# Patient Record
Sex: Female | Born: 1980 | Race: White | Hispanic: Yes | Marital: Single | Smoking: Never smoker
Health system: Southern US, Community
[De-identification: ages and names within clinical notes are randomized; demographics above are authoritative.]

## PROBLEM LIST (undated history)

## (undated) DIAGNOSIS — IMO0002 Reserved for concepts with insufficient information to code with codable children: Principal | ICD-10-CM

## (undated) DIAGNOSIS — E079 Disorder of thyroid, unspecified: Secondary | ICD-10-CM

## (undated) DIAGNOSIS — K8020 Calculus of gallbladder without cholecystitis without obstruction: Secondary | ICD-10-CM

## (undated) DIAGNOSIS — E669 Obesity, unspecified: Secondary | ICD-10-CM

## (undated) DIAGNOSIS — F419 Anxiety disorder, unspecified: Secondary | ICD-10-CM

## (undated) DIAGNOSIS — K819 Cholecystitis, unspecified: Secondary | ICD-10-CM

## (undated) HISTORY — DX: Reserved for concepts with insufficient information to code with codable children: IMO0002

## (undated) HISTORY — PX: CHOLECYSTECTOMY: SHX55

---

## 2007-10-21 ENCOUNTER — Ambulatory Visit (HOSPITAL_COMMUNITY): Admission: EM | Admit: 2007-10-21 | Discharge: 2007-10-22 | Payer: Self-pay | Admitting: Emergency Medicine

## 2007-10-21 ENCOUNTER — Encounter (INDEPENDENT_AMBULATORY_CARE_PROVIDER_SITE_OTHER): Payer: Self-pay | Admitting: General Surgery

## 2008-11-19 ENCOUNTER — Other Ambulatory Visit: Admission: RE | Admit: 2008-11-19 | Discharge: 2008-11-19 | Payer: Self-pay | Admitting: Family Medicine

## 2009-01-01 IMAGING — CT CT PELVIS W/ CM
2 of 5 series · 17 of 46 positions shown, 19 images · IV contrast (omnipaque)
Comparison: None.

CLINICAL DATA: 27-year-old female with abdominal pain.  
 ABDOMEN CT WITH CONTRAST:
TECHNIQUE: Multidetector CT imaging of the abdomen was performed following the standard protocol during bolus administration of intravenous contrast.
 Contrast:  100 cc Omnipaque 300
TECHNIQUE: Multidetector CT imaging of the pelvis was performed following the standard protocol during bolus administration of intravenous contrast.

[Series 2: abd_pel 5.0 b40f st · axial · 0.69mm/px · z∈[+738,+1164]mm · 14 of 97 slices shown, 16 images]
[im 6/97  soft-tissue]
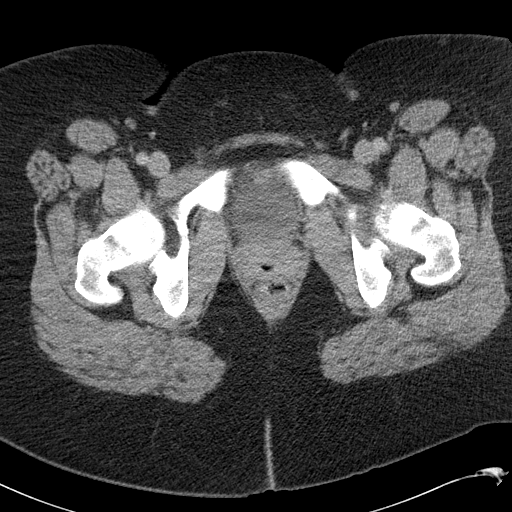
[im 6/97  bone]
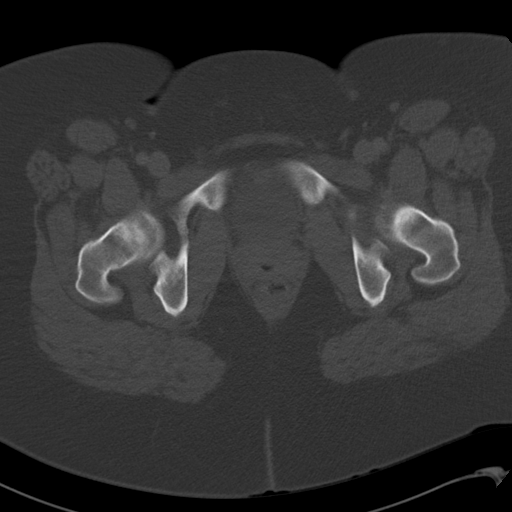
[im 11/97  soft-tissue]
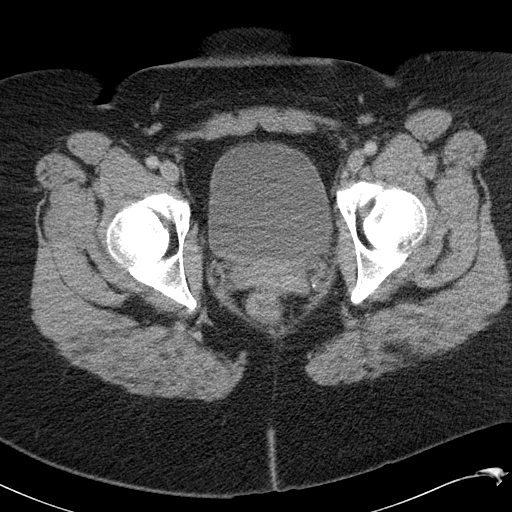
[im 21/97  soft-tissue]
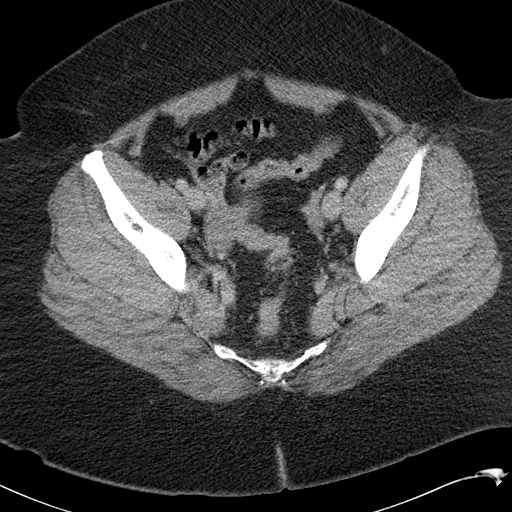
[im 26/97  soft-tissue]
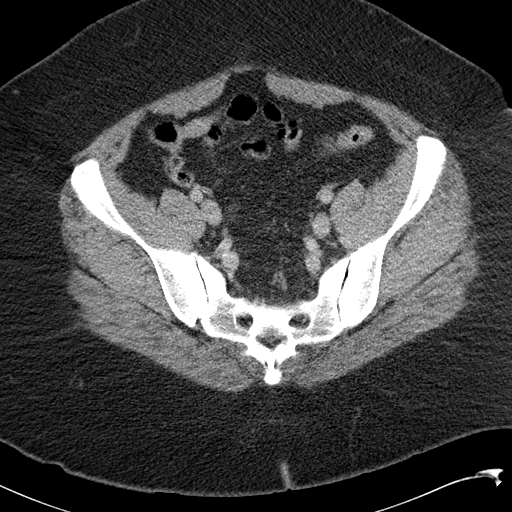
[im 31/97  soft-tissue]
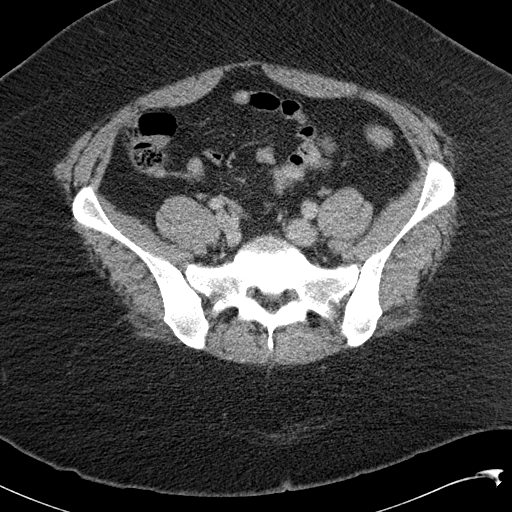
[im 41/97  soft-tissue]
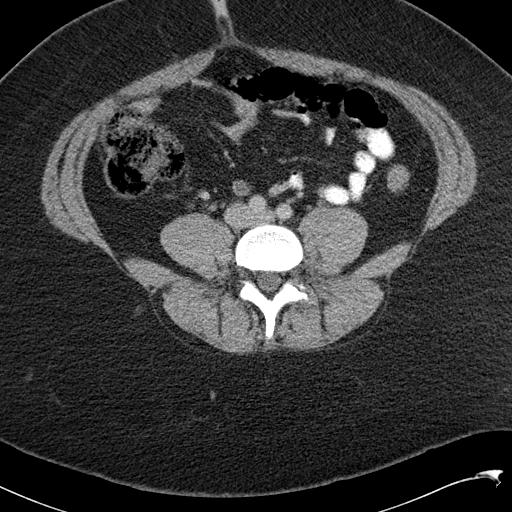
[im 46/97  soft-tissue]
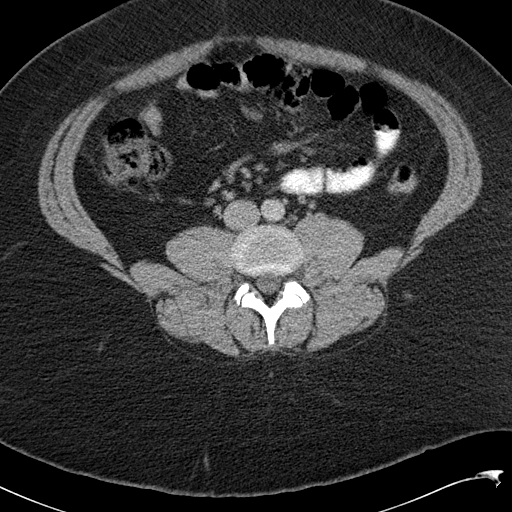
[im 51/97  soft-tissue]
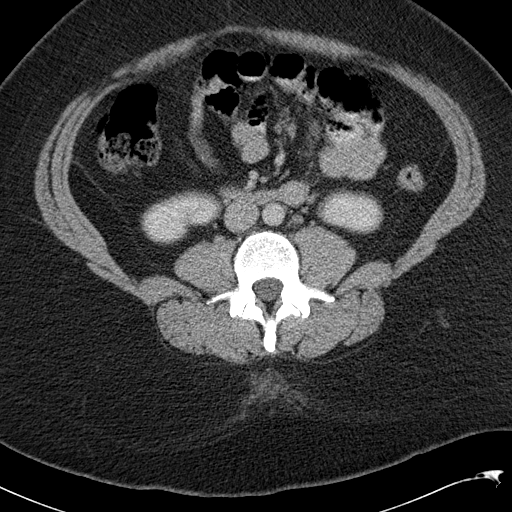
[im 56/97  soft-tissue]
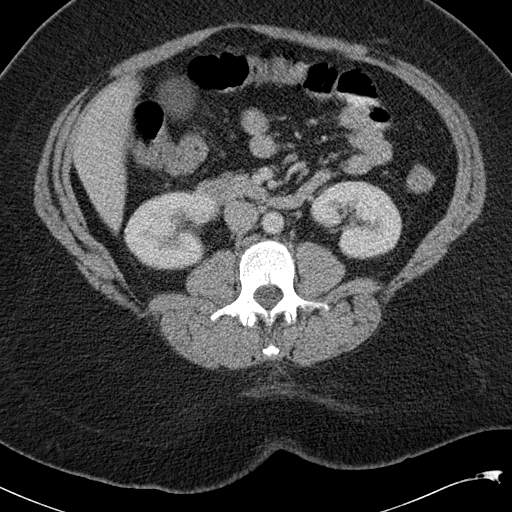
[im 56/97  bone]
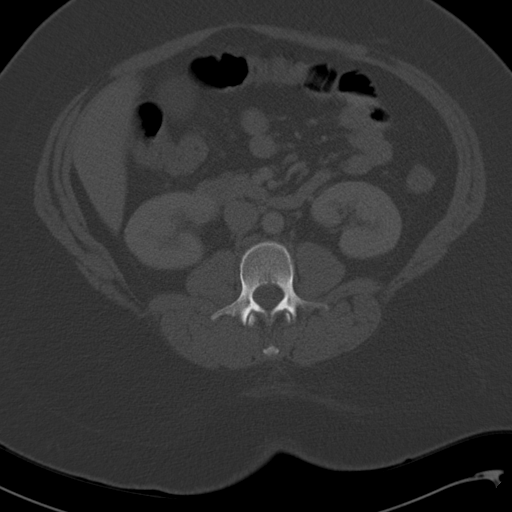
[im 66/97  soft-tissue]
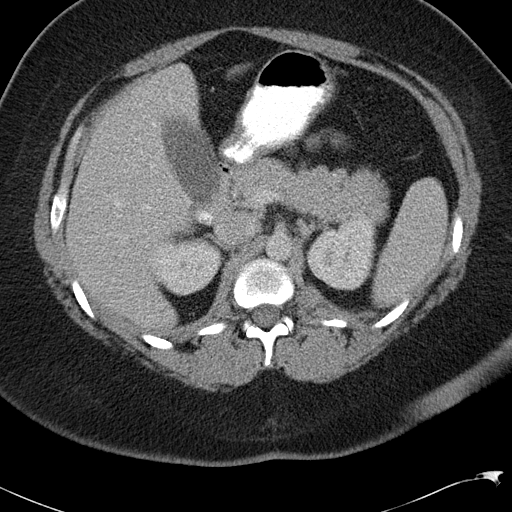
[im 71/97  soft-tissue]
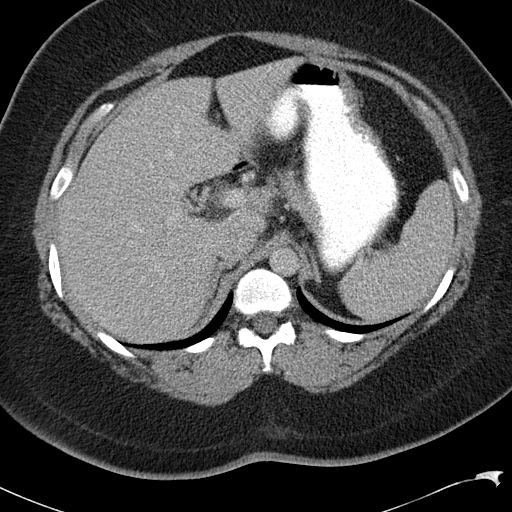
[im 76/97  soft-tissue]
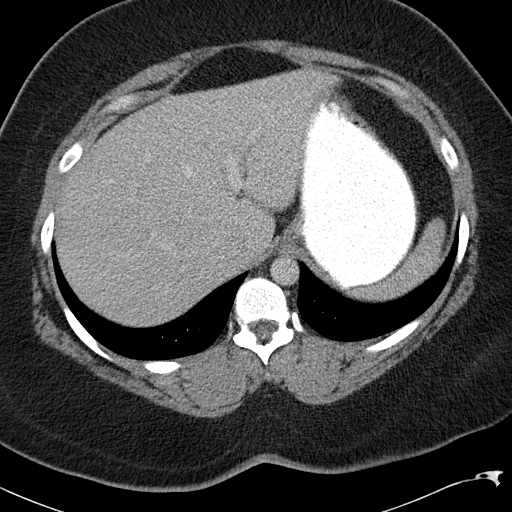
[im 86/97  soft-tissue]
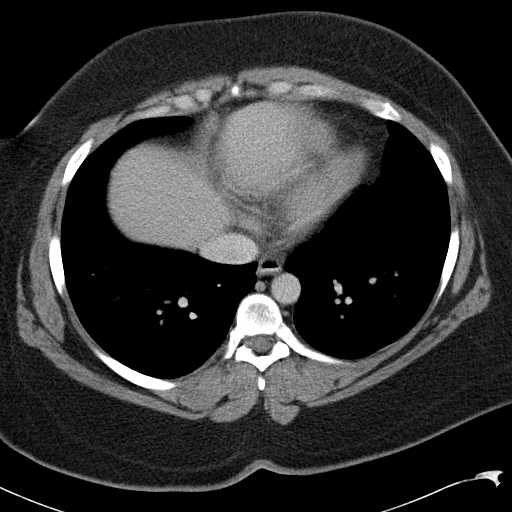
[im 91/97  soft-tissue]
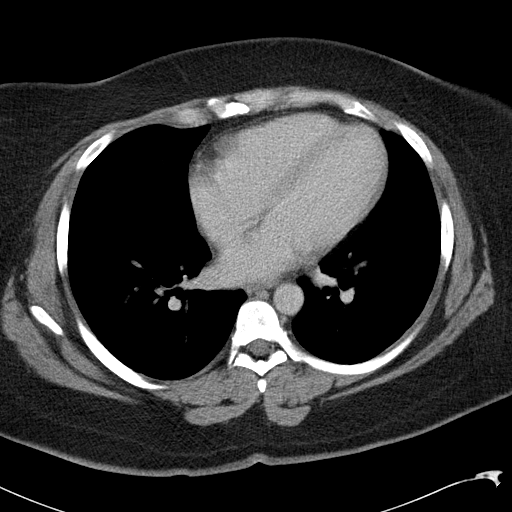

[Series 602: <mpr thick range> · coronal · 0.98mm/px · 3 of 86 slices shown]
[im 29/86  soft-tissue]
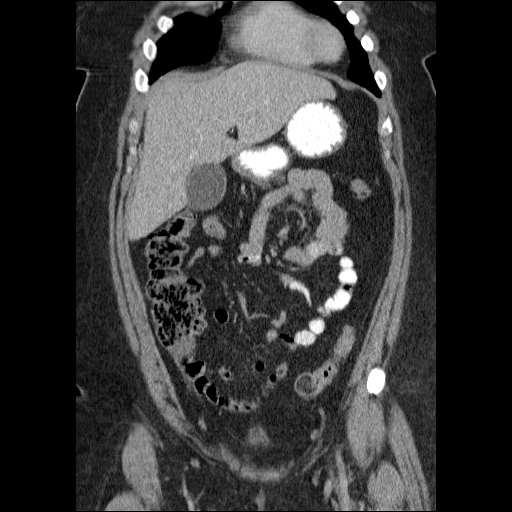
[im 38/86  soft-tissue]
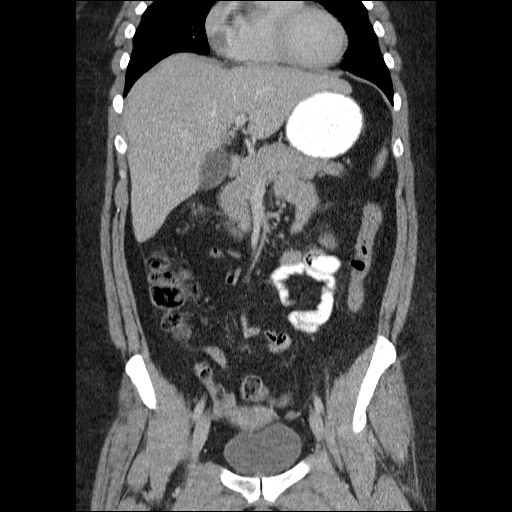
[im 48/86  soft-tissue]
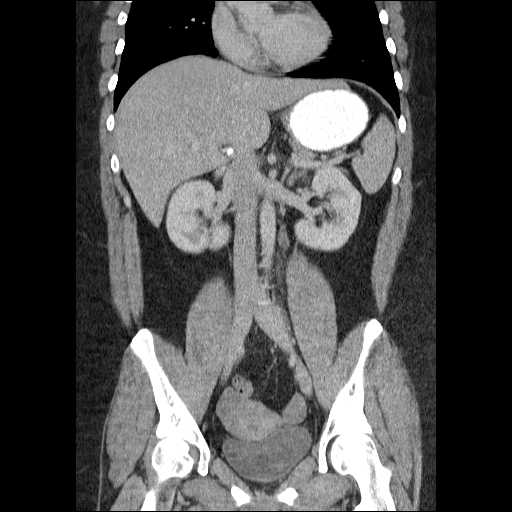

[17 of 46 positions shown; findings below may reference images not displayed]

FINDINGS: Visualized lung bases are clear aside for minor atelectasis.  The liver, spleen, pancreas, adrenal glands, kidneys, stomach, duodenum, and proximal small bowel loops are normal.  There is layering high density material within the neck of the gallbladder compatible with 2 radiopaque gallstones.  Visualized large bowel is normal.  The appendix is normal.  No free fluid or focal inflammatory changes are identified.  The visualized osseous structures are within normal limits.
IMPRESSION: 1.  Cholelithiasis in the gallbladder neck.  If there is clinical concern for acute cholecystitis, right upper quadrant ultrasound is recommended. 
 2.  Otherwise normal abdomen. 
 PELVIS CT WITH CONTRAST:
FINDINGS: No free fluid.  Distal colon is normal.  Bladder, uterus, and adnexa are within normal limits.  No lymphadenopathy.  No suspicious osseous lesion with probable benign bone island in the medial left iliac bone and evidence of bilateral osteitis ilii condensans.
IMPRESSION: No acute findings in the pelvis.

## 2009-01-02 IMAGING — CR DG CHEST 1V PORT
1 series · 1 of 1 positions shown · non-contrast
Comparison: Abdomen and pelvis CT 10/20/2007 and abdominal ultrasound 10/21/2007.

CLINICAL DATA: Pre-op for cholecystitis.  
 PORTABLE CHEST - 1 VIEW:

[view not recorded]
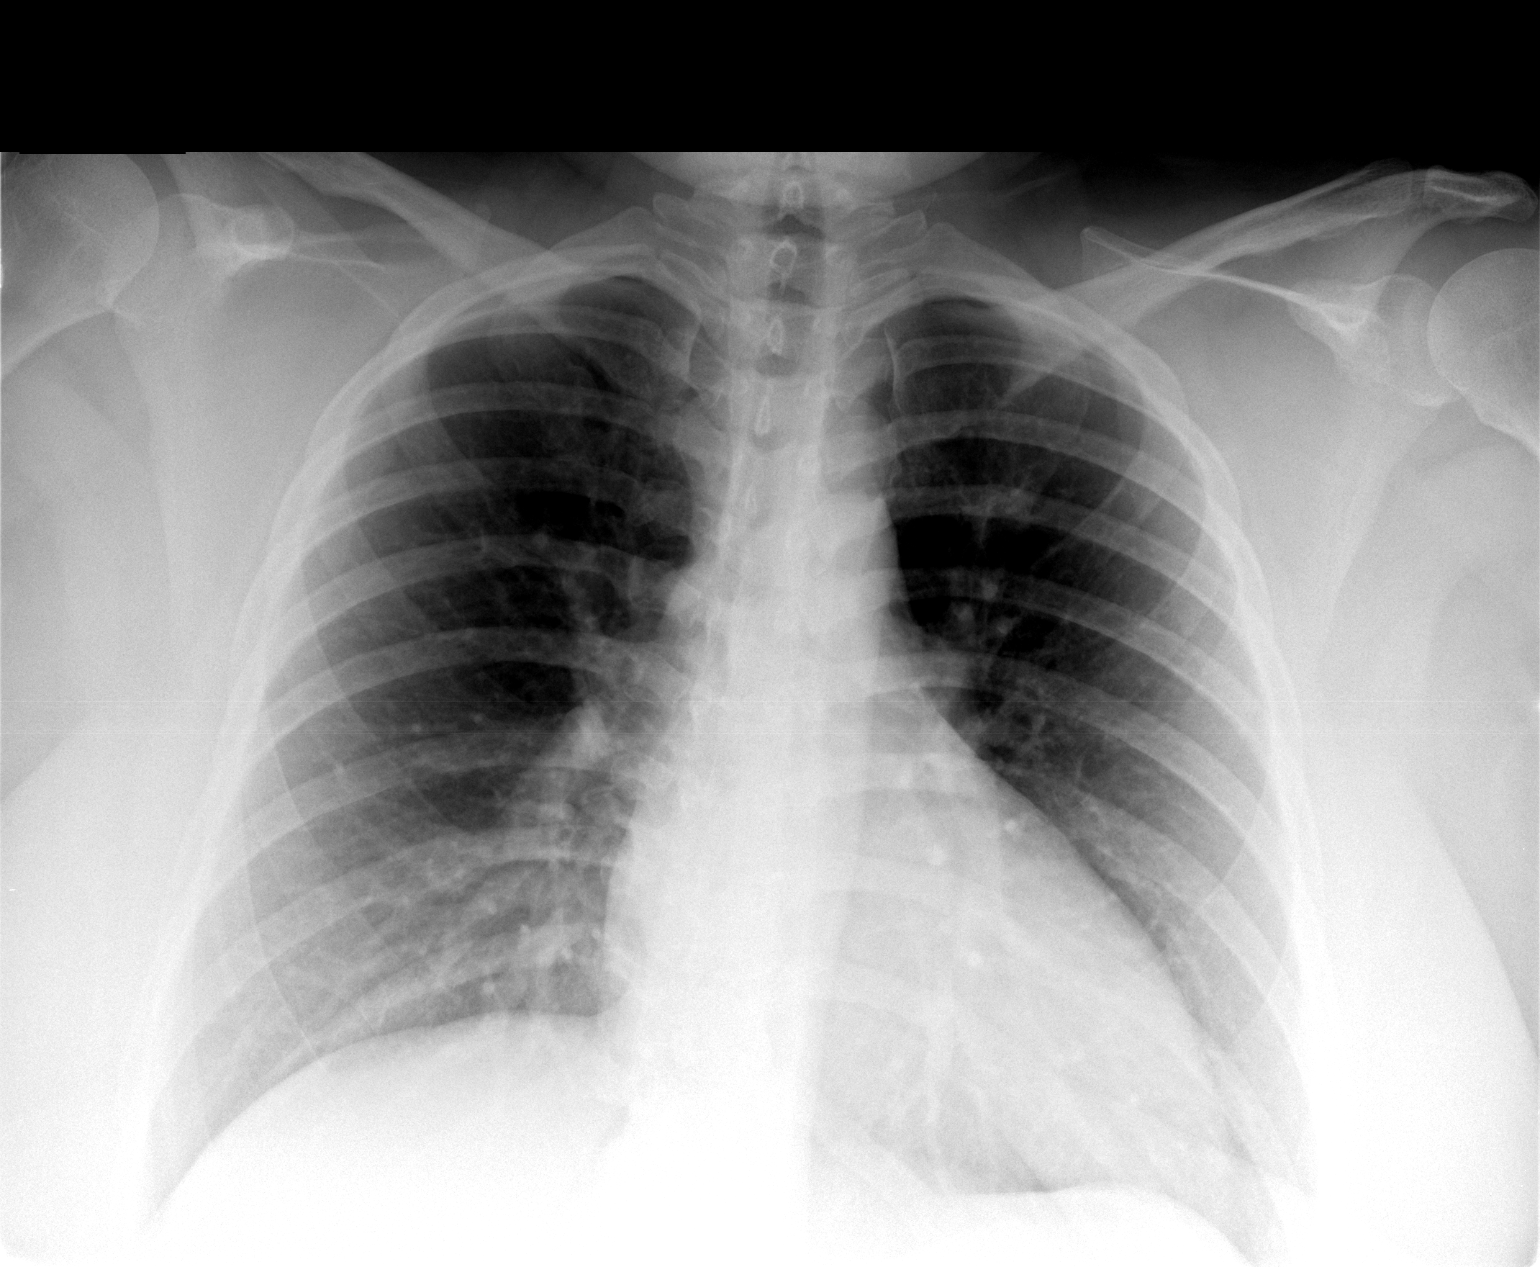

[1 of 1 positions shown; findings below may reference images not displayed]

FINDINGS: The extreme left costophrenic angle is excluded from this image.  Heart and mediastinal contours within normal limits for portable technique.  The visualized portions of the lungs are well expanded and clear.  No pneumothorax, effusion or focal airspace opacities identified.
IMPRESSION: No acute cardiopulmonary disease is identified.  The extreme left costophrenic angle is not included on this exam. An additional view was not performed, as the patient has gone to surgery.

## 2009-11-24 ENCOUNTER — Other Ambulatory Visit: Admission: RE | Admit: 2009-11-24 | Discharge: 2009-11-24 | Payer: Self-pay | Admitting: Family Medicine

## 2011-01-24 NOTE — H&P (Signed)
[name], [name]          ACCOUNT NO.:  [id number]   MEDICAL RECORD NO.:  [id number]          PATIENT TYPE:  INP   LOCATION:  0101                         FACILITY:  [facility]   PHYSICIAN:  Sharlet Salina T. Hoxworth, M.D.DATE OF BIRTH:  [birth year]   DATE OF ADMISSION:  10/20/2007  DATE OF DISCHARGE:                              HISTORY & PHYSICAL   CHIEF COMPLAINT:  Abdominal pain, nausea, and vomiting.   HISTORY OF PRESENT ILLNESS:  [name] is a 30 year old female who  presents to the [facility] Emergency Room with a 24-hour history of  fairly rapid onset of epigastric and right upper quadrant abdominal  pain.  Her pain has been constant.  This has been associated with nausea  and vomiting.  The pain has been persistent.  She presented to the  emergency room.  She denies any previous history of similar episodes.  No fever, chills, or jaundice.   PAST MEDICAL HISTORY:  Significant only for D&C.  She also has been  treated for hypothyroidism the past, but is not on medications after a  recent move.  No other medical or surgical problems or hospitalizations.   MEDICATIONS:  None.   ALLERGIES:  NO KNOWN DRUG ALLERGIES.   SOCIAL HISTORY:  No cigarettes or alcohol.  She is a single mother.  Just started a new job.   FAMILY HISTORY:  Noncontributory.   REVIEW OF SYSTEMS:  GENERAL:  No fever or chills.  HEENT: No vision,  hearing, or swallowing problems.  RESPIRATORY:  Denies shortness of  breath, cough, wheezing, or history of lung problems.  CARDIAC:  Denies  chest pain, palpitations, swelling, or history of heart disease.  ABDOMEN/GI:  As above.  GU: No urinary burning or frequency.   PHYSICAL EXAMINATION:  VITAL SIGNS:  Temperature is 99, pulse 64,  respirations 17, blood pressure 109/63.  GENERAL:  She is a morbidly obese female, in no acute distress.  SKIN:  Warm and dry.  No rashes or infection.  HEENT: No palpable mass or thyromegaly.  Sclerae nonicteric.   Oropharynx  clear.  LYMPH NODES:  No cervical, or supraclavicular nodes palpable.  LUNGS:  Clear, without wheezing or increased work of breathing.  CARDIAC:  Regular rate and rhythm.  No murmurs.  No edema.  ABDOMEN:  Obese, tender in the epigastrium and right upper quadrant,  with guarding.  No discernible masses or organomegaly.  EXTREMITIES:  No joint swelling or deformity.  NEUROLOGIC:  Alert and oriented.  Motor and sensory exams grossly  normal.   LABORATORY:  Urinalysis negative.  Pregnancy test negative.  Electrolytes, BUN, and creatinine all within normal limits.  LFTs  normal.  White count elevated at 14.7, hemoglobin 15.1, lipase is 18.   CT scan was initially obtained in the emergency room, which shows  gallstones.  No other abnormalities.  Subsequent ultrasound shows  gallstones, with a stone in the neck of the gallbladder and thickening  of the gallbladder wall up to 8 mm.   ASSESSMENT AND PLAN:  Cholelithiasis and acute cholecystitis.  The  patient is being admitted for pain control and IV fluids.  Will  be  started on antibiotics.  I have recommended proceeding with urgent  laparoscopic cholecystectomy.      Lorne Skeens. Hoxworth, M.D.  Electronically Signed     BTH/MEDQ  D:  10/21/2007  T:  10/22/2007  Job:  [id number]

## 2011-01-24 NOTE — Op Note (Signed)
NAMESHAQUANNA, LYCAN          ACCOUNT NO.:  [id number]   MEDICAL RECORD NO.:  [id number]          PATIENT TYPE:  INP   LOCATION:  1320                         FACILITY:  [facility]   PHYSICIAN:  Sharlet Salina T. Hoxworth, M.D.DATE OF BIRTH:  [birth year]   DATE OF PROCEDURE:  10/21/2007  DATE OF DISCHARGE:                               OPERATIVE REPORT   PREOPERATIVE DIAGNOSIS:  Cholelithiasis and cholecystitis.   POSTOPERATIVE DIAGNOSIS:  Cholelithiasis and cholecystitis.   PROCEDURE:  Laparoscopic cholecystectomy with intraoperative  cholangiogram.   SURGEON:  Sharlet Salina T. Hoxworth, M.D.   ASSISTANT:  Thornton Park. Daphine Deutscher, M.D.   ANESTHESIA:  General.   BRIEF HISTORY:  Ms. Hansman is a 30 year old female with a history of  morbid obesity who presents with acute epigastric and right upper  quadrant abdominal pain.  Workup has included a CT scan and gallbladder  ultrasound obtained through the emergency room which reveals thickening  and inflammation of the gallbladder wall and cholelithiasis.  She is  felt to have acute cholecystitis and laparoscopic cholecystectomy has  been recommend and accepted.  The nature of the procedure, indications,  risks of bleeding, infection, bile leak, bile duct injury, possible need  for an open procedure were discussed and understood.  The patient is  brought to the operating room for this procedure.   DESCRIPTION OF OPERATION:  The patient was brought to the operating room  and placed in the supine position on the operating table and general  endotracheal anesthesia was induced.  The abdomen was widely sterilely  prepped and draped.  The correct patient and procedure were verified.  She had received preoperative IV antibiotics and subcutaneous Lovenox.  PAS were in place.  Local anesthesia was used to infiltrate the trocar  sites prior to the incisions.  A 1 cm incision was made in the midline  above the umbilicus and dissection carried down to the  midline fascia  which was sharply incised for 1 cm and the peritoneum entered under  direct vision.  Through a mattress suture of 0 Vicryl, the Hassan trocar  was placed and pneumoperitoneum established.  Under direct vision, a 10  mm trocar was placed in the subxiphoid area and two 5 mm trocars on the  right subcostal margin.  The gallbladder was acutely inflamed and  edematous.  The fundus was able to be grasped and elevated above the  liver and the infundibulum retracted inferolaterally.  The peritoneum  anterior and posterior to Calot's triangle was incised and fibrofatty  tissue was stripped off the neck of the gallbladder toward the porta  hepatis.  The distal gallbladder was thoroughly dissected.  The cystic  gallbladder junction was dissected 360 degrees and the cystic duct  dissected out over about 1 cm.  When the anatomy appeared clear, an  operative cholangiogram was obtained through the cystic duct.  On  opening the cystic duct, several small stones were milked out and then  clear bile returned.  The operative cholangiogram revealed good filling  of a normal common bile duct and intrahepatic ducts with free flow into  the duodenum and no filling defects.  Following  this, the cholangiocath  was removed, the cystic duct was doubly clipped proximally, and divided.  The anterior and posterior branch of the cystic artery were divided  between clips.  The gallbladder was then dissected free from its bed  using hook cautery, placed in an EndoCatch bag, and brought out through  the umbilicus.  The gallbladder did contain white bile.  The right upper  quadrant was thoroughly irrigated until clear and hemostasis assured.  The trocars were removed and all CO2 evacuated.  The mattress suture  was secured at the supraumbilical incision.  The skin incisions were  closed with interrupted subcuticular Monocryl and Dermabond.  Sponge,  needle, and instrument counts were correct.  The patient  was taken to  recovery in good condition.      Lorne Skeens. Hoxworth, M.D.  Electronically Signed     BTH/MEDQ  D:  10/21/2007  T:  10/22/2007  Job:  [id number]

## 2011-01-30 ENCOUNTER — Other Ambulatory Visit: Payer: Self-pay

## 2011-01-30 ENCOUNTER — Other Ambulatory Visit (HOSPITAL_COMMUNITY)
Admission: RE | Admit: 2011-01-30 | Discharge: 2011-01-30 | Disposition: A | Discharge status: Home / Self Care | Payer: Medicaid Other | Source: Ambulatory Visit | Attending: Internal Medicine | Admitting: Internal Medicine

## 2011-01-30 DIAGNOSIS — Z01419 Encounter for gynecological examination (general) (routine) without abnormal findings: Secondary | ICD-10-CM | POA: Insufficient documentation

## 2011-06-02 LAB — COMPREHENSIVE METABOLIC PANEL
Albumin: 4
BUN: 5 — ABNORMAL LOW
Creatinine, Ser: 0.84
Glucose, Bld: 94
Total Bilirubin: 0.9
Total Protein: 7.3

## 2011-06-02 LAB — URINALYSIS, ROUTINE W REFLEX MICROSCOPIC
Bilirubin Urine: NEGATIVE
Glucose, UA: NEGATIVE
Hgb urine dipstick: NEGATIVE
Ketones, ur: 40 — AB
Protein, ur: NEGATIVE

## 2011-06-02 LAB — CBC
HCT: 43.7
Hemoglobin: 15.1 — ABNORMAL HIGH
MCV: 84
Platelets: 414 — ABNORMAL HIGH
RDW: 13.8

## 2011-06-02 LAB — DIFFERENTIAL
Basophils Absolute: 0
Basophils Relative: 0
Lymphocytes Relative: 7 — ABNORMAL LOW
Monocytes Absolute: 0.5
Monocytes Relative: 4
Neutro Abs: 13.2 — ABNORMAL HIGH
Neutrophils Relative %: 90 — ABNORMAL HIGH

## 2012-05-17 ENCOUNTER — Encounter (HOSPITAL_COMMUNITY): Payer: Self-pay | Admitting: Emergency Medicine

## 2012-05-17 ENCOUNTER — Emergency Department (INDEPENDENT_AMBULATORY_CARE_PROVIDER_SITE_OTHER): Payer: Self-pay

## 2012-05-17 ENCOUNTER — Emergency Department (HOSPITAL_COMMUNITY)
Admission: EM | Admit: 2012-05-17 | Discharge: 2012-05-17 | Disposition: A | Discharge status: Home / Self Care | Payer: Self-pay | Source: Home / Self Care | Attending: Emergency Medicine | Admitting: Emergency Medicine

## 2012-05-17 DIAGNOSIS — R0600 Dyspnea, unspecified: Secondary | ICD-10-CM

## 2012-05-17 DIAGNOSIS — R0609 Other forms of dyspnea: Secondary | ICD-10-CM

## 2012-05-17 DIAGNOSIS — R079 Chest pain, unspecified: Secondary | ICD-10-CM

## 2012-05-17 DIAGNOSIS — R0989 Other specified symptoms and signs involving the circulatory and respiratory systems: Secondary | ICD-10-CM

## 2012-05-17 HISTORY — DX: Cholecystitis, unspecified: K81.9

## 2012-05-17 HISTORY — DX: Anxiety disorder, unspecified: F41.9

## 2012-05-17 HISTORY — DX: Obesity, unspecified: E66.9

## 2012-05-17 HISTORY — DX: Disorder of thyroid, unspecified: E07.9

## 2012-05-17 HISTORY — DX: Calculus of gallbladder without cholecystitis without obstruction: K80.20

## 2012-05-17 MED ORDER — GI COCKTAIL ~~LOC~~
ORAL | Status: AC
  Filled 2012-05-17: qty 30

## 2012-05-17 MED ORDER — FAMOTIDINE 20 MG PO TABS: 20.0000 mg | ORAL_TABLET | Freq: Two times a day (BID) | ORAL | Status: DC

## 2012-05-17 MED ORDER — NAPROXEN 500 MG PO TABS: 500.0000 mg | ORAL_TABLET | Freq: Two times a day (BID) | ORAL | Status: AC

## 2012-05-17 MED ORDER — PANTOPRAZOLE SODIUM 40 MG PO TBEC: 40.0000 mg | DELAYED_RELEASE_TABLET | Freq: Every day | ORAL | Status: DC

## 2012-05-17 MED ORDER — GI COCKTAIL ~~LOC~~
30.0000 mL | Freq: Once | ORAL | Status: AC
  Administered 2012-05-17: 30 mL via ORAL

## 2012-05-17 NOTE — ED Notes (Signed)
Reports "something sitting on chest" sharp with breathing in

## 2012-05-17 NOTE — ED Provider Notes (Signed)
History     CSN: [id number]  Arrival date & time 05/17/12  1120   First MD Initiated Contact with Patient 05/17/12 1204      Chief Complaint  Patient presents with  . Chest Pain    (Consider location/radiation/quality/duration/timing/severity/associated sxs/prior treatment) HPI Comments: Patient reports 3 days of constant substernal chest pain, chest tightness, shortness of breath, which is worse when she lies down. No alleviating factors. She's not tried anything for her symptoms. Reports feeling very anxious, difficulty focusing, worrying. No exertional component. No coughing, wheezing. No nausea, vomiting, fevers. No orthopnea. No sore throat, waterbrash, abdominal pain. No diaphoresis, radiation to arm or neck, or through to the back. No palpitations, presyncope, syncope. No calf pain, leg swelling, recent prolonged immobilization, hemoptysis, exogenous estrogen, history of DVT or PE, history of cancer. No family history of early MI, or sudden cardiac death. Patient is not a smoker. No history of diabetes, hypertension, CAD, asthma. History of hypothyroidism, has not been on her Synthroid in over 4 months.  ROS as noted in HPI. All other ROS negative.  Patient is a 31 y.o. female presenting with shortness of breath. The history is provided by the patient. No language interpreter was used.  Shortness of Breath  The current episode started 3 to 5 days ago. The problem occurs continuously. The problem has been unchanged. Nothing relieves the symptoms. The symptoms are aggravated by a supine position. Associated symptoms include chest pain, chest pressure and shortness of breath. Pertinent negatives include no orthopnea, no fever, no rhinorrhea, no sore throat, no stridor, no cough and no wheezing. Her past medical history does not include asthma.    Past Medical History  Diagnosis Date  . Anxiety   . Thyroid disease   . Cholecystitis   . Cholelithiasis   . Obesity     Past Surgical  History  Procedure Date  . Cholecystectomy     History reviewed. No pertinent family history.  History  Substance Use Topics  . Smoking status: Never Smoker   . Smokeless tobacco: Not on file  . Alcohol Use: No    OB History    Grav Para Term Preterm Abortions TAB SAB Ect Mult Living                  Review of Systems  Constitutional: Negative for fever.  HENT: Negative for sore throat and rhinorrhea.   Respiratory: Positive for shortness of breath. Negative for cough, wheezing and stridor.   Cardiovascular: Positive for chest pain. Negative for orthopnea.    Allergies  Review of patient's allergies indicates no known allergies.  Home Medications   Current Outpatient Rx  Name Route Sig Dispense Refill  . FAMOTIDINE 20 MG PO TABS Oral Take 1 tablet (20 mg total) by mouth 2 (two) times daily. 40 tablet 0  . LEVOTHYROXINE SODIUM 100 MCG PO TABS Oral Take 100 mcg by mouth daily. Off for 4 months-ran out of medicine    . NAPROXEN 500 MG PO TABS Oral Take 1 tablet (500 mg total) by mouth 2 (two) times daily. 20 tablet 0  . PANTOPRAZOLE SODIUM 40 MG PO TBEC Oral Take 1 tablet (40 mg total) by mouth daily. 20 tablet 0    BP 113/79  Pulse 75  Temp 98.3 F (36.8 C) (Oral)  Resp 20  SpO2 100%  LMP 05/10/2012  Physical Exam  Nursing note and vitals reviewed. Constitutional: She is oriented to person, place, and time. She appears well-developed and well-nourished.  Morbidly obese.  HENT:  Head: Normocephalic and atraumatic.  Eyes: Conjunctivae and EOM are normal.  Neck: Normal range of motion.  Cardiovascular: Normal rate, regular rhythm, normal heart sounds and intact distal pulses.   No murmur heard. Pulmonary/Chest: Effort normal and breath sounds normal. She exhibits tenderness.       Mild tenderness at the sternum. Patient able to tolerate lying flat.  Abdominal: Soft. Bowel sounds are normal. She exhibits no distension. There is no tenderness. There is no  rebound and no guarding.  Musculoskeletal: Normal range of motion. She exhibits no edema and no tenderness.  Neurological: She is alert and oriented to person, place, and time.  Skin: Skin is warm and dry.  Psychiatric: She has a normal mood and affect. Her behavior is normal. Judgment and thought content normal.    ED Course  Procedures (including critical care time)  Labs Reviewed - No data to display Dg Chest 2 View  05/17/2012  *RADIOLOGY REPORT*  Clinical Data: Shortness of breath  CHEST - 2 VIEW  Comparison: October 21, 2007  Findings: Lungs clear.  Heart size and pulmonary vascularity are normal.  No adenopathy.  No bone lesions.  IMPRESSION: Lungs clear.   Original Report Authenticated By: Arvin Collard. WOODRUFF III, M.D.      1. Dyspnea   2. Nonspecific chest pain     MDM  Previous records reviewed. Additional medical history obtained..  Imaging reviewed by myself. N[facility]D. Report per radiologist.  EKG: Normal sinus rhythm, rate 75. Normal axis, normal intervals. No hypertrophy. No ST or T wave changes. No previous EKG for comparison.  Pt appears to be in NAD, non-toxic, normotensive, satting 100% on room air, heart rate less than 100. No evidence of STEMI or acute ischemic changes on EKG, which was obtained when patient was symptomatic. chest x-ray is normal. Meets PERC criteria- think PE unlikely.Marland Kitchen Has reproducible tenderness along the sternum. Doubt ACS, PE, ruptured esophagus, aortic dissection, PNA or PTX. Patient's only risk factor for ACS is obesity. Suspect patient's symptoms are partially from reflux, and with a component of restrictive lung disease due to excess breast tissue/obesity. Also seems to have some mild anxiety. Will try some Pepcid and Protonix, Naprosyn for the chest wall pain, have her try some relaxation and over-the-counter remedies for her anxiety. Providing with local resources for ongoing care. Discussed signs and symptoms that should prompt immediate return  to the ER. Patient agrees with plan.  Luiz Blare, MD 05/17/12 1357

## 2012-05-17 NOTE — ED Notes (Signed)
3 days ago, noticed chest tightness, then unable to focus, worried, unable to sleep.  Reports chest pain started 2 days ago, intermittent.

## 2012-05-17 NOTE — ED Notes (Signed)
Discharge pending administration of medication

## 2014-01-09 DIAGNOSIS — IMO0002 Reserved for concepts with insufficient information to code with codable children: Secondary | ICD-10-CM

## 2014-01-09 HISTORY — DX: Reserved for concepts with insufficient information to code with codable children: IMO0002

## 2014-02-19 ENCOUNTER — Ambulatory Visit (INDEPENDENT_AMBULATORY_CARE_PROVIDER_SITE_OTHER): Payer: Managed Care, Other (non HMO) | Admitting: Women's Health

## 2014-02-19 ENCOUNTER — Encounter: Payer: Self-pay | Admitting: Women's Health

## 2014-02-19 VITALS — BP 125/80 | Ht 66.0 in | Wt 319.6 lb

## 2014-02-19 DIAGNOSIS — B3731 Acute candidiasis of vulva and vagina: Secondary | ICD-10-CM

## 2014-02-19 DIAGNOSIS — B373 Candidiasis of vulva and vagina: Secondary | ICD-10-CM

## 2014-02-19 DIAGNOSIS — R109 Unspecified abdominal pain: Secondary | ICD-10-CM

## 2014-02-19 DIAGNOSIS — E039 Hypothyroidism, unspecified: Secondary | ICD-10-CM | POA: Insufficient documentation

## 2014-02-19 LAB — WET PREP FOR TRICH, YEAST, CLUE
Clue Cells Wet Prep HPF POC: NONE SEEN
Trich, Wet Prep: NONE SEEN

## 2014-02-19 MED ORDER — FLUCONAZOLE 150 MG PO TABS: 150.0000 mg | ORAL_TABLET | Freq: Once | ORAL | Status: DC

## 2014-02-19 NOTE — Progress Notes (Signed)
Parmis Farag April 11, 1981 [id number]    History:    Presents for referral for patient reported abnormal Pap high risk HPV and pelvic pain with exam. 2006 cryo- for abnormal Pap with normal Paps after. Had a negative STD screen, has not been sexually active in > 1 years, denies vaginal discharge, urinary symptoms, abdominal pain or fever. Numerous labs at primary care normal, hypothyroid on Synthroid. Vitamin D currently on 50,000 weekly. Morbid obesity.  Past medical history, past surgical history, family history and social history were all reviewed and documented in the EPIC chart. Originally from [place], working for [employer]. One daughter Wyn Forster 8 doing well.  ROS:  A  12 point ROS was performed and pertinent positives and negatives are included.  Exam:  Filed Vitals:   02/19/14 1529  BP: 125/80    General appearance: Morbid obesity Thyroid:  Symmetrical, normal in size, without palpable masses or nodularity. Respiratory  Auscultation:  Clear without wheezing or rhonchi Cardiovascular  Auscultation:  Regular rate, without rubs, murmurs or gallops  Edema/varicosities:  Not grossly evident Abdominal  Soft,nontender, without masses, guarding or rebound.  Liver/spleen:  No organomegaly noted  Hernia:  None appreciated  Skin  Inspection:  Grossly normal   Breasts: Examined lying and sitting.     Right: Without masses, retractions, discharge or axillary adenopathy.     Left: Without masses, retractions, discharge or axillary adenopathy. Gentitourinary   Inguinal/mons:  Normal without inguinal adenopathy  External genitalia:  Normal  BUS/Urethra/Skene's glands:  Normal  Vagina:  Normal, scant white discharge, wet prep positive for yeast  Cervix:  Normal  Uterus:   normal in size, shape and contour.  Midline and mobile  Adnexa/parametria:     Rt: Without masses or tenderness.   Lt: Without masses or tenderness.  Anus and perineum: Normal  Digital rectal exam: Normal sphincter  tone without palpated masses or tenderness  Assessment/Plan:  33 y.o. SF G1P1 for followup abnormal Pap HR HPV and pelvic pain with exam at primary care.  Minimal pain with speculum  and pelvic exam. Yeast vaginitis Morbid obesity LGSIL with HR HPV typing  Plan: Schedule colposcopy with Dr. Audie Box at earliest convenience. Diflucan 150 by mouth times one dose with refill. Reviewed importance of decreasing calories/carbs and increasing exercise for weight loss.  Note: This dictation was prepared with Dragon/digital dictation.  Any transcriptional errors that result are unintentional. Harrington Challenger [facility], 5:10 PM 02/19/2014

## 2014-02-19 NOTE — Patient Instructions (Signed)
Exercise to Stay Healthy Exercise helps you become and stay healthy. EXERCISE IDEAS AND TIPS Choose exercises that:  You enjoy.  Fit into your day. You do not need to exercise really hard to be healthy. You can do exercises at a slow or medium level and stay healthy. You can:  Stretch before and after working out.  Try yoga, Pilates, or tai chi.  Lift weights.  Walk fast, swim, jog, run, climb stairs, bicycle, dance, or rollerskate.  Take aerobic classes. Exercises that burn about 150 calories:  Running 1  miles in 15 minutes.  Playing volleyball for 45 to 60 minutes.  Washing and waxing a car for 45 to 60 minutes.  Playing touch football for 45 minutes.  Walking 1  miles in 35 minutes.  Pushing a stroller 1  miles in 30 minutes.  Playing basketball for 30 minutes.  Raking leaves for 30 minutes.  Bicycling 5 miles in 30 minutes.  Walking 2 miles in 30 minutes.  Dancing for 30 minutes.  Shoveling snow for 15 minutes.  Swimming laps for 20 minutes.  Walking up stairs for 15 minutes.  Bicycling 4 miles in 15 minutes.  Gardening for 30 to 45 minutes.  Jumping rope for 15 minutes.  Washing windows or floors for 45 to 60 minutes. Document Released: 09/30/2010 Document Revised: 11/20/2011 Document Reviewed: 09/30/2010 [employer] Patient Information 2014 [employer].  

## 2014-02-25 ENCOUNTER — Encounter: Payer: Self-pay | Admitting: Women's Health

## 2014-03-12 ENCOUNTER — Encounter: Payer: Self-pay | Admitting: Gynecology

## 2014-03-12 ENCOUNTER — Ambulatory Visit (INDEPENDENT_AMBULATORY_CARE_PROVIDER_SITE_OTHER): Payer: Managed Care, Other (non HMO) | Admitting: Gynecology

## 2014-03-12 DIAGNOSIS — IMO0002 Reserved for concepts with insufficient information to code with codable children: Secondary | ICD-10-CM

## 2014-03-12 DIAGNOSIS — R6889 Other general symptoms and signs: Secondary | ICD-10-CM

## 2014-03-12 DIAGNOSIS — R87810 Cervical high risk human papillomavirus (HPV) DNA test positive: Secondary | ICD-10-CM

## 2014-03-12 MED ORDER — BETAMETHASONE DIPROPIONATE AUG 0.05 % EX CREA: TOPICAL_CREAM | Freq: Two times a day (BID) | CUTANEOUS | Status: DC

## 2014-03-12 NOTE — Addendum Note (Signed)
Addended by: Dara LordsFONTAINE, Zakee Deerman P on: 03/12/2014 04:22 PM   Modules accepted: Orders

## 2014-03-12 NOTE — Patient Instructions (Signed)
Office will call you with the biopsy results 

## 2014-03-12 NOTE — Progress Notes (Signed)
Patient ID: Dawn Stephens Kovacich, female   DOB: 03-07-1981, 33 y.o.   MRN: [id number] Dawn Stephens Miu 03-07-1981 [id number]        33 y.o.  [id number] presents for colposcopy. History of abnormal Pap smears 2006 leading to colposcopy. Patient does not remember being treated with cryocautery other treatments. Subsequently had abnormal Pap smear 2010 with repeat a year later and it was normal 2011/2012. 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  Past medical history,surgical history, problem list, medications, allergies, family history and social history were all reviewed and documented in the EPIC chart.  Directed ROS with pertinent positives and negatives documented in the history of present illness/assessment and plan.  Exam: Kim assistant General appearance:  Normal Pelvic: External BUS vagina normal. Cervix grossly normal. Uterus normal size midline mobile nontender. Exam limited by abdominal girth. Adnexa without gross masses or tenderness.  Colposcopy adequate after acetic acid cleanse with no abnormalities seen. ECC performed. Physical Exam  Genitourinary:       Assessment/Plan:  33 y.o. [id number] [place] Grade unknown. 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 Colposcopy is normal. ECC performed. Patient will followup for results. Assuming negative and plan repeat Pap smear with HPV in 1 year. I reviewed with the patient the issues of dysplasia, high grade/low grade, progression/regression and the HPV association.   Note: This document was prepared with digital dictation and possible smart phrase technology. Any transcriptional errors that result from this process are unintentional.   Dara LordsFONTAINE,Purl Claytor P MD, 4:11 PM 03/12/2014

## 2014-03-17 ENCOUNTER — Encounter: Payer: Self-pay | Admitting: Gynecology

## 2014-07-13 ENCOUNTER — Encounter: Payer: Self-pay | Admitting: Gynecology

## 2021-07-22 ENCOUNTER — Other Ambulatory Visit: Payer: Self-pay | Admitting: Obstetrics and Gynecology

## 2021-07-22 DIAGNOSIS — R928 Other abnormal and inconclusive findings on diagnostic imaging of breast: Secondary | ICD-10-CM

## 2021-07-30 ENCOUNTER — Other Ambulatory Visit: Payer: Self-pay

## 2021-07-30 ENCOUNTER — Ambulatory Visit
Admission: RE | Admit: 2021-07-30 | Discharge: 2021-07-30 | Disposition: A | Discharge status: Home / Self Care | Payer: 59 | Source: Ambulatory Visit | Attending: Obstetrics and Gynecology | Admitting: Obstetrics and Gynecology

## 2021-07-30 ENCOUNTER — Ambulatory Visit
Admission: RE | Admit: 2021-07-30 | Discharge: 2021-07-30 | Disposition: A | Discharge status: Home / Self Care | Payer: Self-pay | Source: Ambulatory Visit | Attending: Obstetrics and Gynecology | Admitting: Obstetrics and Gynecology

## 2021-07-30 DIAGNOSIS — R928 Other abnormal and inconclusive findings on diagnostic imaging of breast: Secondary | ICD-10-CM

## 2021-08-12 ENCOUNTER — Other Ambulatory Visit: Payer: Self-pay

## 2021-08-12 ENCOUNTER — Telehealth: Payer: Self-pay | Admitting: Internal Medicine

## 2021-08-12 ENCOUNTER — Ambulatory Visit (INDEPENDENT_AMBULATORY_CARE_PROVIDER_SITE_OTHER): Payer: 59 | Admitting: Internal Medicine

## 2021-08-12 ENCOUNTER — Encounter: Payer: Self-pay | Admitting: Internal Medicine

## 2021-08-12 VITALS — BP 132/84 | HR 86 | Ht 66.0 in | Wt 345.0 lb

## 2021-08-12 DIAGNOSIS — E559 Vitamin D deficiency, unspecified: Secondary | ICD-10-CM

## 2021-08-12 DIAGNOSIS — E039 Hypothyroidism, unspecified: Secondary | ICD-10-CM | POA: Diagnosis not present

## 2021-08-12 LAB — VITAMIN D 25 HYDROXY (VIT D DEFICIENCY, FRACTURES): VITD: 33.36 ng/mL (ref 30.00–100.00)

## 2021-08-12 LAB — TSH: TSH: 9.39 u[IU]/mL — ABNORMAL HIGH (ref 0.35–5.50)

## 2021-08-12 MED ORDER — LEVOTHYROXINE SODIUM 137 MCG PO TABS: 137.0000 ug | ORAL_TABLET | Freq: Every day | ORAL | 6 refills | Status: DC

## 2021-08-12 NOTE — Progress Notes (Signed)
Name: Dawn Stephens  MRN/ DOB: [id number], February 24, 1981    Age/ Sex: 40 y.o., female    PCP: Penelope Galas, MD   Reason for Endocrinology Evaluation: Hypothyroidism     Date of Initial Endocrinology Evaluation: 08/12/2021     HPI: Dawn Stephens is a 40 y.o. female with a past medical history of HTN and hypothyroidism. The patient presented for initial endocrinology clinic visit on 08/12/2021 for consultative assistance with her Hypothyroidism.   She has been diagnosed with hypothyroidism at age 72. She was on LT-4 replacement then switched to armour thyroid until 2017 due to fatigue, hair loss and weight gain. She was switched to levothyroxine 06/2021    She lost 2 lbs since then  She continues with fatigue  Denies constipation  Denies palpitations  Does not sleep well at night  Denies local neck swelling  LMP 11/5th - regular   She does not have sleep apnea  NO biotin intake and no radiation exposure   Currently on Levothyroxine 100 mcg daily  Vitamin D 5000 iu daily   NO known FH of thyroid disease   HISTORY:  Past Medical History:  Past Medical History:  Diagnosis Date   Anxiety    Cholecystitis    Cholelithiasis    LGSIL (low grade squamous intraepithelial dysplasia) 01/2014   Positive high risk HPV screen Colposcopy normal with negative ECC   Obesity    Thyroid disease    Past Surgical History:  Past Surgical History:  Procedure Laterality Date   CHOLECYSTECTOMY      Social History:  reports that she has never smoked. She does not have any smokeless tobacco history on file. She reports that she does not drink alcohol and does not use drugs. Family History: family history includes Breast cancer in her maternal grandmother; Diabetes in her maternal grandmother; Hyperlipidemia in her maternal grandmother.   HOME MEDICATIONS: Allergies as of 08/12/2021   No Known Allergies      Medication List        Accurate as of August 12, 2021  8:03  AM. If you have any questions, ask your nurse or doctor.          STOP taking these medications    augmented betamethasone dipropionate 0.05 % cream Commonly known as: Diprolene AF Stopped by: Scarlette Shorts, MD   famotidine 20 MG tablet Commonly known as: PEPCID Stopped by: Scarlette Shorts, MD       TAKE these medications    ALPRAZolam 0.25 MG tablet Commonly known as: XANAX Take 0.25 mg by mouth at bedtime as needed for anxiety.   amLODipine 5 MG tablet Commonly known as: NORVASC Take 5 mg by mouth daily.   CELEXA PO Take 20 mg by mouth daily at 6 (six) AM.   levothyroxine 100 MCG tablet Commonly known as: SYNTHROID Take 100 mcg by mouth daily. Off for 4 months-ran out of medicine   Vitamin D3 1.25 MG (50000 UT) Caps Take 1 capsule by mouth once a week.          REVIEW OF SYSTEMS: A comprehensive ROS was conducted with the patient and is negative except as per HPI     OBJECTIVE:  VS: BP 132/84 (BP Location: Left Arm, Patient Position: Sitting, Cuff Size: Large)   Pulse 86   Ht 5\' 6"  (1.676 m)   Wt (!) 345 lb (156.5 kg)   LMP 07/18/2021   SpO2 99%   BMI 55.68 kg/m  Wt Readings from Last 3 Encounters:  08/12/21 (!) 345 lb (156.5 kg)  02/19/14 (!) 319 lb 9.6 oz (145 kg)     EXAM: General: Pt appears well and is in NAD  Neck: General: Supple without adenopathy. Thyroid: Thyroid size normal.  No goiter or nodules appreciated. No thyroid bruit.  Lungs: Clear with good BS bilat with no rales, rhonchi, or wheezes  Heart: Auscultation: RRR.  Abdomen: Normoactive bowel sounds, soft, nontender, without masses or organomegaly palpable  Extremities:  BL LE: No pretibial edema normal ROM and strength.  Skin: Hair: Texture and amount normal with gender appropriate distribution Skin Inspection: No rashes Skin Palpation: Skin temperature, texture, and thickness normal to palpation  Mental Status: Judgment, insight: Intact Orientation:  Oriented to time, place, and person Mood and affect: No depression, anxiety, or agitation     DATA REVIEWED:  Latest Reference Range & Units 08/12/21 08:10  TSH 0.35 - 5.50 uIU/mL 9.39 (H)  (H): Data is abnormally high    ASSESSMENT/PLAN/RECOMMENDATIONS:   Hypothyroidism :  - Pt with fatigue and weight gain  - Pt educated extensively on the correct way to take levothyroxine (first thing in the morning with water, 30 minutes before eating or taking other medications). - Pt encouraged to double dose the following day if she were to miss a dose given long half-life of levothyroxine. - Was on armour thyroid for years , initially helped with symptoms of fatigue and weight gain - Discussed LT3 replacement if needed in the future   Medications :  Stop Levothyroxine 100 mcg  Start Levothyroxine 137 mcg daily    2. Vitamin D Deficiency:  - Vitamin D 5000 iu daily    F/U in 4 months  Labs in 8 weeks   Signed electronically by: Lyndle Herrlich, MD  [facility]  [facility] [address] [place] [place], [place] [address] Phone: [phone] FAX: [phone]   CC: Penelope Galas, MD [address] [place] [address] Phone: [phone] Fax: [phone]   Return to Endocrinology clinic as below: No future appointments.

## 2021-08-12 NOTE — Patient Instructions (Signed)

## 2021-08-12 NOTE — Telephone Encounter (Signed)
Please let the pt know that her thyroid is off but but as bad as I thought.    Please ask her to stop Levothyroxine 100 mcg and start 137 mcg daily     Thanks

## 2021-08-12 NOTE — Telephone Encounter (Signed)
Patient advised and verbalized understanding 

## 2021-08-15 LAB — THYROID PEROXIDASE ANTIBODY: Thyroperoxidase Ab SerPl-aCnc: 330 IU/mL — ABNORMAL HIGH (ref <9)

## 2021-10-07 ENCOUNTER — Telehealth: Payer: Self-pay | Admitting: Internal Medicine

## 2021-10-07 ENCOUNTER — Other Ambulatory Visit (INDEPENDENT_AMBULATORY_CARE_PROVIDER_SITE_OTHER): Payer: 59

## 2021-10-07 ENCOUNTER — Other Ambulatory Visit: Payer: Self-pay

## 2021-10-07 DIAGNOSIS — E039 Hypothyroidism, unspecified: Secondary | ICD-10-CM

## 2021-10-07 LAB — TSH: TSH: 19.67 u[IU]/mL — ABNORMAL HIGH (ref 0.35–5.50)

## 2021-10-07 MED ORDER — LEVOTHYROXINE SODIUM 150 MCG PO TABS: 150.0000 ug | ORAL_TABLET | Freq: Every day | ORAL | 3 refills | Status: DC

## 2021-10-07 NOTE — Telephone Encounter (Signed)
Patient notified and verbalized understanding. 

## 2021-10-07 NOTE — Telephone Encounter (Signed)
Please let the patient know that her thyroid is not getting any better and she needs more thyroid hormone     Let the patient know that I have sent a new prescription of levothyroxine 150 MCG daily   (She currently has 137 levothyroxine dose at home, she may take 2 of those on Sundays and 1 tablet daily Monday through Saturday until she is done with this bottle, then she can pick up the 150 and take those 1 tablet daily including Sunday   Thanks

## 2021-12-16 ENCOUNTER — Ambulatory Visit: Payer: 59 | Admitting: Internal Medicine

## 2021-12-23 ENCOUNTER — Ambulatory Visit: Payer: 59 | Admitting: Internal Medicine

## 2022-01-19 ENCOUNTER — Ambulatory Visit (INDEPENDENT_AMBULATORY_CARE_PROVIDER_SITE_OTHER): Payer: 59 | Admitting: Internal Medicine

## 2022-01-19 ENCOUNTER — Encounter: Payer: Self-pay | Admitting: Internal Medicine

## 2022-01-19 VITALS — BP 124/70 | HR 80 | Ht 66.0 in | Wt 345.0 lb

## 2022-01-19 DIAGNOSIS — E039 Hypothyroidism, unspecified: Secondary | ICD-10-CM | POA: Diagnosis not present

## 2022-01-19 DIAGNOSIS — E559 Vitamin D deficiency, unspecified: Secondary | ICD-10-CM

## 2022-01-19 DIAGNOSIS — E063 Autoimmune thyroiditis: Secondary | ICD-10-CM

## 2022-01-19 DIAGNOSIS — R5383 Other fatigue: Secondary | ICD-10-CM | POA: Diagnosis not present

## 2022-01-19 DIAGNOSIS — E538 Deficiency of other specified B group vitamins: Secondary | ICD-10-CM

## 2022-01-19 LAB — TSH: TSH: 7.18 u[IU]/mL — ABNORMAL HIGH (ref 0.35–5.50)

## 2022-01-19 LAB — COMPREHENSIVE METABOLIC PANEL
ALT: 13 U/L (ref 0–35)
AST: 17 U/L (ref 0–37)
Albumin: 4 g/dL (ref 3.5–5.2)
Alkaline Phosphatase: 47 U/L (ref 39–117)
BUN: 13 mg/dL (ref 6–23)
CO2: 26 mEq/L (ref 19–32)
Calcium: 9.2 mg/dL (ref 8.4–10.5)
Chloride: 105 mEq/L (ref 96–112)
Creatinine, Ser: 0.95 mg/dL (ref 0.40–1.20)
GFR: 73.49 mL/min (ref >60.00)
Glucose, Bld: 92 mg/dL (ref 70–99)
Potassium: 4.2 mEq/L (ref 3.5–5.1)
Sodium: 138 mEq/L (ref 135–145)
Total Bilirubin: 0.3 mg/dL (ref 0.2–1.2)
Total Protein: 7.2 g/dL (ref 6.0–8.3)

## 2022-01-19 LAB — CBC
HCT: 40.9 % (ref 36.0–46.0)
Hemoglobin: 13.5 g/dL (ref 12.0–15.0)
MCHC: 33 g/dL (ref 30.0–36.0)
MCV: 84.2 fl (ref 78.0–100.0)
Platelets: 464 10*3/uL — ABNORMAL HIGH (ref 150.0–400.0)
RBC: 4.86 Mil/uL (ref 3.87–5.11)
RDW: 14.2 % (ref 11.5–15.5)
WBC: 6.9 10*3/uL (ref 4.0–10.5)

## 2022-01-19 LAB — VITAMIN B12: Vitamin B-12: 203 pg/mL — ABNORMAL LOW (ref 211–911)

## 2022-01-19 LAB — VITAMIN D 25 HYDROXY (VIT D DEFICIENCY, FRACTURES): VITD: 28.9 ng/mL — ABNORMAL LOW (ref 30.00–100.00)

## 2022-01-19 LAB — T4, FREE: Free T4: 0.82 ng/dL (ref 0.60–1.60)

## 2022-01-19 NOTE — Progress Notes (Signed)
? ? ?Name: Dawn Stephens  ?MRN/ DOB: [id number], 06-26-1981    ?Age/ Sex: 41 y.o., female   ? ?PCP: Penelope Galas, MD   ?Reason for Endocrinology Evaluation: Hypothyroidism  ?   ?Date of Initial Endocrinology Evaluation: 01/19/2022   ? ? ?HPI: ?[name] is a 41 y.o. female with a past medical history of HTN and hypothyroidism. The patient presented for initial endocrinology clinic visit on 01/19/2022 for consultative assistance with her Hypothyroidism.  ? ?She has been diagnosed with hypothyroidism at age 33. She was on LT-4 replacement then switched to armour thyroid until 2017 due to fatigue, hair loss and weight gain. She was switched to levothyroxine 06/2021  ? ? ?She does not have sleep apnea  ? ?NO known FH of thyroid disease  ? ? ?SUBJECTIVE:  ? ? ? ?Today (01/19/22):  Rudene Christians.  ? ?Continues with fatigue  ?Sleeps well most nights  ?No sleep apnea  ?Weight stable  ?Denies constipation  ?Denies palpitations  ?She has been depressed , she is on antidepressants and sees a therapist  ? ? ? ?HISTORY:  ?Past Medical History:  ?Past Medical History:  ?Diagnosis Date  ? Anxiety   ? Cholecystitis   ? Cholelithiasis   ? LGSIL (low grade squamous intraepithelial dysplasia) 01/2014  ? Positive high risk HPV screen Colposcopy normal with negative ECC  ? Obesity   ? Thyroid disease   ? ?Past Surgical History:  ?Past Surgical History:  ?Procedure Laterality Date  ? CHOLECYSTECTOMY    ?  ?Social History:  reports that she has never smoked. She does not have any smokeless tobacco history on file. She reports that she does not drink alcohol and does not use drugs. ?Family History: family history includes Breast cancer in her maternal grandmother; Diabetes in her maternal grandmother; Hyperlipidemia in her maternal grandmother. ? ? ?HOME MEDICATIONS: ?Allergies as of 01/19/2022   ?No Known Allergies ?  ? ?  ?Medication List  ?  ? ?  ? Accurate as of Jan 19, 2022  7:29 AM. If you have any questions, ask  your nurse or doctor.  ?  ?  ? ?  ? ?ALPRAZolam 0.25 MG tablet ?Commonly known as: Prudy Feeler ?Take 0.25 mg by mouth at bedtime as needed for anxiety. ?  ?amLODipine 5 MG tablet ?Commonly known as: NORVASC ?Take 5 mg by mouth daily. ?  ?CELEXA PO ?Take 20 mg by mouth daily at 6 (six) AM. ?  ?levothyroxine 150 MCG tablet ?Commonly known as: SYNTHROID ?Take 1 tablet (150 mcg total) by mouth daily. ?  ?Vitamin D3 1.25 MG (50000 UT) Caps ?Take 1 capsule by mouth once a week. ?  ? ?  ?  ? ? ?REVIEW OF SYSTEMS: ?A comprehensive ROS was conducted with the patient and is negative except as per HPI  ? ? ? ?OBJECTIVE:  ?VS: BP 124/70 (BP Location: Left Arm, Patient Position: Sitting, Cuff Size: Large)   Pulse 80   Ht 5\' 6"  (1.676 m)   Wt (!) 345 lb (156.5 kg)   SpO2 99%   BMI 55.68 kg/m?   ? ?Wt Readings from Last 3 Encounters:  ?01/19/22 (!) 345 lb (156.5 kg)  ?08/12/21 (!) 345 lb (156.5 kg)  ?02/19/14 (!) 319 lb 9.6 oz (145 kg)  ? ? ? ?EXAM: ?General: Pt appears well and is in NAD  ?Neck: General: Supple without adenopathy. ?Thyroid: Thyroid size normal.  No goiter or nodules appreciated.   ?Lungs: Clear with good BS  bilat with no rales, rhonchi, or wheezes  ?Heart: Auscultation: RRR.  ?Abdomen: Normoactive bowel sounds, soft, nontender, without masses or organomegaly palpable  ?Extremities:  ?BL LE: No pretibial edema normal ROM and strength.  ?Mental Status: Judgment, insight: Intact ?Orientation: Oriented to time, place, and person ?Mood and affect: No depression, anxiety, or agitation  ? ? ? ?DATA REVIEWED: ? Latest Reference Range & Units 01/19/22 07:54  ?Sodium 135 - 145 mEq/L 138  ?Potassium 3.5 - 5.1 mEq/L 4.2  ?Chloride 96 - 112 mEq/L 105  ?CO2 19 - 32 mEq/L 26  ?Glucose 70 - 99 mg/dL 92  ?BUN 6 - 23 mg/dL 13  ?Creatinine 0.40 - 1.20 mg/dL 4.31  ?Calcium 8.4 - 10.5 mg/dL 9.2  ?Alkaline Phosphatase 39 - 117 U/L 47  ?Albumin 3.5 - 5.2 g/dL 4.0  ?AST 0 - 37 U/L 17  ?ALT 0 - 35 U/L 13  ?Total Protein 6.0 - 8.3 g/dL 7.2   ?Total Bilirubin 0.2 - 1.2 mg/dL 0.3  ?GFR >60.00 mL/min 73.49  ?VITD 30.00 - 100.00 ng/mL 28.90 (L)  ?Vitamin B12 211 - 911 pg/mL 203 (L)  ?WBC 4.0 - 10.5 K/uL 6.9  ?RBC 3.87 - 5.11 Mil/uL 4.86  ?Hemoglobin 12.0 - 15.0 g/dL 54.0  ?HCT 36.0 - 46.0 % 40.9  ?MCV 78.0 - 100.0 fl 84.2  ?MCHC 30.0 - 36.0 g/dL 08.6  ?RDW 11.5 - 15.5 % 14.2  ?Platelets 150.0 - 400.0 K/uL 464.0 (H)  ? ? Latest Reference Range & Units 01/19/22 07:54  ?TSH 0.35 - 5.50 uIU/mL 7.18 (H)  ?T4,Free(Direct) 0.60 - 1.60 ng/dL 7.61  ? ? ? ? ? ? ? ? Latest Reference Range & Units 08/12/21 08:10  ?Thyroperoxidase Ab SerPl-aCnc <9 IU/mL 330 (H)  ? ?ASSESSMENT/PLAN/RECOMMENDATIONS:  ? ?Hashimoto's Thyroiditis : ? ?- Pt with fatigue and weight gain  ?-TSH trending down but continues to be above goal, will increase the dose as below ?- Pt educated extensively on the correct way to take levothyroxine (first thing in the morning with water, 30 minutes before eating or taking other medications). ?- Pt encouraged to double dose the following day if she were to miss a dose given long half-life of levothyroxine. ? ? ?Medications : ? ?Stop levothyroxine 150 mcg daily  ?Start levothyroxine 200 mcg daily ? ? ?2. Vitamin D Deficiency: ? ? ?-Slightly low, no change ?- Continue Vitamin D 5000 iu daily  ? ? ? ?3. Fatigue : ? ?-She has no evidence of anemia, her CMP is normal. ?-She has been noted with low vitamin B12, patient will be advised to start OTC vitamin B12 1000 mcg daily ?-She was also advised to add a multivitamin daily ? ? ? ?F/U in 6 months  ?Labs in 8 weeks  ? ? ?Addendum: Discussed lab results with the patient on 01/20/2022 ? ?Signed electronically by: ?Abby Raelyn Mora, MD ? ?[facility]  ?[facility] ?[address] ?[place], [place] [address] ?Phone: [phone] ?FAX: [phone] ? ? ?CC: ?Chow, Zannie Cove, MD ?[address] ?[place] [address] ?Phone: [phone] ?Fax: [phone] ? ? ?Return to  Endocrinology clinic as below: ?Future Appointments  ?Date Time Provider Department Center  ?01/19/2022  7:30 AM Kinzlee Selvy, Konrad Dolores, MD [facility] None  ?  ? ? ? ? ? ?

## 2022-01-20 DIAGNOSIS — E538 Deficiency of other specified B group vitamins: Secondary | ICD-10-CM | POA: Insufficient documentation

## 2022-01-20 DIAGNOSIS — R5383 Other fatigue: Secondary | ICD-10-CM | POA: Insufficient documentation

## 2022-01-20 DIAGNOSIS — E039 Hypothyroidism, unspecified: Secondary | ICD-10-CM | POA: Insufficient documentation

## 2022-01-20 DIAGNOSIS — E559 Vitamin D deficiency, unspecified: Secondary | ICD-10-CM | POA: Insufficient documentation

## 2022-01-20 MED ORDER — LEVOTHYROXINE SODIUM 200 MCG PO TABS: 200.0000 ug | ORAL_TABLET | Freq: Every day | ORAL | 3 refills | Status: DC

## 2022-03-13 ENCOUNTER — Other Ambulatory Visit (INDEPENDENT_AMBULATORY_CARE_PROVIDER_SITE_OTHER): Payer: 59

## 2022-03-13 DIAGNOSIS — E039 Hypothyroidism, unspecified: Secondary | ICD-10-CM

## 2022-03-13 LAB — TSH: TSH: 4.76 u[IU]/mL (ref 0.35–5.50)

## 2022-03-14 ENCOUNTER — Telehealth: Payer: Self-pay | Admitting: Internal Medicine

## 2022-03-14 NOTE — Telephone Encounter (Signed)
Let the pt know that her thyroid has normalized and to continue on levothyroxine 200 mcg daily     Thanks

## 2022-03-15 NOTE — Telephone Encounter (Signed)
Patient notified and verbalized understanding. 

## 2022-07-26 ENCOUNTER — Ambulatory Visit: Payer: 59 | Admitting: Internal Medicine

## 2022-07-26 NOTE — Progress Notes (Deleted)
Name: Dawn Stephens  MRN/ DOB: [id number], 1981-09-06    Age/ Sex: 41 y.o., female    PCP: Penelope Galas, MD   Reason for Endocrinology Evaluation: Hypothyroidism     Date of Initial Endocrinology Evaluation: 07/26/2022     HPI: Dawn Stephens is a 41 y.o. female with a past medical history of HTN and hypothyroidism. The patient presented for initial endocrinology clinic visit on 07/26/2022 for consultative assistance with her Hypothyroidism.   She has been diagnosed with hypothyroidism at age 65. She was on LT-4 replacement then switched to armour thyroid until 2017 due to fatigue, hair loss and weight gain. She was switched to levothyroxine 06/2021    She does not have sleep apnea   NO known FH of thyroid disease    SUBJECTIVE:     Today (07/26/22):  Dawn Stephens.   Continues with fatigue  Sleeps well most nights  No sleep apnea  Weight stable  Denies constipation  Denies palpitations  She sees a therapist     Levothyroxine 200 mcg daily     HISTORY:  Past Medical History:  Past Medical History:  Diagnosis Date   Anxiety    Cholecystitis    Cholelithiasis    LGSIL (low grade squamous intraepithelial dysplasia) 01/2014   Positive high risk HPV screen Colposcopy normal with negative ECC   Obesity    Thyroid disease    Past Surgical History:  Past Surgical History:  Procedure Laterality Date   CHOLECYSTECTOMY      Social History:  reports that she has never smoked. She does not have any smokeless tobacco history on file. She reports that she does not drink alcohol and does not use drugs. Family History: family history includes Breast cancer in her maternal grandmother; Diabetes in her maternal grandmother; Hyperlipidemia in her maternal grandmother.   HOME MEDICATIONS: Allergies as of 07/26/2022   No Known Allergies      Medication List        Accurate as of July 26, 2022  6:56 AM. If you have any questions, ask your  nurse or doctor.          ALPRAZolam 0.25 MG tablet Commonly known as: XANAX Take 0.25 mg by mouth at bedtime as needed for anxiety.   amLODipine 5 MG tablet Commonly known as: NORVASC Take 5 mg by mouth daily.   CELEXA PO Take 20 mg by mouth daily at 6 (six) AM.   levothyroxine 200 MCG tablet Commonly known as: SYNTHROID Take 1 tablet (200 mcg total) by mouth daily.   Vitamin D3 1.25 MG (50000 UT) Caps Take 1 capsule by mouth once a week.          REVIEW OF SYSTEMS: A comprehensive ROS was conducted with the patient and is negative except as per HPI     OBJECTIVE:  VS: There were no vitals taken for this visit.   Wt Readings from Last 3 Encounters:  01/19/22 (!) 345 lb (156.5 kg)  08/12/21 (!) 345 lb (156.5 kg)  02/19/14 (!) 319 lb 9.6 oz (145 kg)     EXAM: General: Pt appears well and is in NAD  Neck: General: Supple without adenopathy. Thyroid: Thyroid size normal.  No goiter or nodules appreciated.   Lungs: Clear with good BS bilat with no rales, rhonchi, or wheezes  Heart: Auscultation: RRR.  Abdomen: Normoactive bowel sounds, soft, nontender, without masses or organomegaly palpable  Extremities:  BL LE: No pretibial edema normal ROM and  strength.  Mental Status: Judgment, insight: Intact Orientation: Oriented to time, place, and person Mood and affect: No depression, anxiety, or agitation     DATA REVIEWED:  Latest Reference Range & Units 01/19/22 07:54  Sodium 135 - 145 mEq/L 138  Potassium 3.5 - 5.1 mEq/L 4.2  Chloride 96 - 112 mEq/L 105  CO2 19 - 32 mEq/L 26  Glucose 70 - 99 mg/dL 92  BUN 6 - 23 mg/dL 13  Creatinine 7.51 - 0.25 mg/dL 8.52  Calcium 8.4 - 77.8 mg/dL 9.2  Alkaline Phosphatase 39 - 117 U/L 47  Albumin 3.5 - 5.2 g/dL 4.0  AST 0 - 37 U/L 17  ALT 0 - 35 U/L 13  Total Protein 6.0 - 8.3 g/dL 7.2  Total Bilirubin 0.2 - 1.2 mg/dL 0.3  GFR >24.23 mL/min 73.49  VITD 30.00 - 100.00 ng/mL 28.90 (L)  Vitamin B12 211 - 911 pg/mL  203 (L)  WBC 4.0 - 10.5 K/uL 6.9  RBC 3.87 - 5.11 Mil/uL 4.86  Hemoglobin 12.0 - 15.0 g/dL 53.6  HCT 14.4 - 31.5 % 40.9  MCV 78.0 - 100.0 fl 84.2  MCHC 30.0 - 36.0 g/dL 40.0  RDW 86.7 - 61.9 % 14.2  Platelets 150.0 - 400.0 K/uL 464.0 (H)    Latest Reference Range & Units 01/19/22 07:54  TSH 0.35 - 5.50 uIU/mL 7.18 (H)  T4,Free(Direct) 0.60 - 1.60 ng/dL 5.09     Latest Reference Range & Units 08/12/21 08:10  Thyroperoxidase Ab SerPl-aCnc <9 IU/mL 330 (H)   ASSESSMENT/PLAN/RECOMMENDATIONS:   Hashimoto's Thyroiditis :  - Pt with fatigue and weight gain  -TSH trending down but continues to be above goal, will increase the dose as below - Pt educated extensively on the correct way to take levothyroxine (first thing in the morning with water, 30 minutes before eating or taking other medications). - Pt encouraged to double dose the following day if she were to miss a dose given long half-life of levothyroxine.   Medications :  Stop levothyroxine 150 mcg daily  Start levothyroxine 200 mcg daily   2. Vitamin D Deficiency:   -Slightly low, no change - Continue Vitamin D 5000 iu daily       F/U in 6 months  Labs in 8 weeks    Addendum: Discussed lab results with the patient on 01/20/2022  Signed electronically by: Lyndle Herrlich, MD  [facility]  [facility] [address] [place] [place], [place] [address] Phone: [phone] FAX: [phone]   CC: Penelope Galas, MD [address] [place] [address] Phone: [phone] Fax: [phone]   Return to Endocrinology clinic as below: Future Appointments  Date Time Provider Department Center  07/26/2022  7:30 AM Graden Hoshino, Konrad Dolores, MD [facility] None

## 2022-10-11 ENCOUNTER — Encounter: Payer: Self-pay | Admitting: Internal Medicine

## 2022-10-11 ENCOUNTER — Ambulatory Visit (INDEPENDENT_AMBULATORY_CARE_PROVIDER_SITE_OTHER): Payer: 59 | Admitting: Internal Medicine

## 2022-10-11 VITALS — BP 126/70 | HR 95 | Ht 66.0 in | Wt 339.0 lb

## 2022-10-11 DIAGNOSIS — E559 Vitamin D deficiency, unspecified: Secondary | ICD-10-CM | POA: Diagnosis not present

## 2022-10-11 DIAGNOSIS — E538 Deficiency of other specified B group vitamins: Secondary | ICD-10-CM | POA: Diagnosis not present

## 2022-10-11 DIAGNOSIS — E063 Autoimmune thyroiditis: Secondary | ICD-10-CM | POA: Diagnosis not present

## 2022-10-11 DIAGNOSIS — R5383 Other fatigue: Secondary | ICD-10-CM

## 2022-10-11 LAB — VITAMIN B12: Vitamin B-12: 256 pg/mL (ref 211–911)

## 2022-10-11 LAB — TSH: TSH: 0.1 u[IU]/mL — ABNORMAL LOW (ref 0.35–5.50)

## 2022-10-11 LAB — VITAMIN D 25 HYDROXY (VIT D DEFICIENCY, FRACTURES): VITD: 28.67 ng/mL — ABNORMAL LOW (ref 30.00–100.00)

## 2022-10-11 MED ORDER — DEXAMETHASONE 1 MG PO TABS: 1.0000 mg | ORAL_TABLET | Freq: Once | ORAL | 0 refills | Status: AC

## 2022-10-11 NOTE — Progress Notes (Signed)
Name: Dawn Stephens  MRN/ DOB: [id number], 1981-04-09    Age/ Sex: 42 y.o., female    PCP: Penelope Galas, MD   Reason for Endocrinology Evaluation: Hypothyroidism     Date of Initial Endocrinology Evaluation: 08/12/2021    HPI: Dawn Stephens is a 42 y.o. female with a past medical history of HTN and hypothyroidism. The patient presented for initial endocrinology clinic visit on 08/12/2021 for consultative assistance with her Hypothyroidism.   She has been diagnosed with hypothyroidism at age 55. She was on LT-4 replacement then switched to armour thyroid until 2017 due to fatigue, hair loss and weight gain. She was switched to levothyroxine 06/2021    She does not have sleep apnea   Mother and maternal aunt with thyroid disease    SUBJECTIVE:     Today (10/11/22):  Dawn Stephens is here for follow-up on Hashimoto's thyroiditis  Continues with fatigue  She sees a therapist and psychiatry for depression , recently discontinue Wellbuterin and on Abilify  Denies constipation  Denies palpitations  Denies local neck swelling    Levothyroxine 200 mcg daily Vitamin D3 5000 IU daily MVI daily  HISTORY:  Past Medical History:  Past Medical History:  Diagnosis Date   Anxiety    Cholecystitis    Cholelithiasis    LGSIL (low grade squamous intraepithelial dysplasia) 01/2014   Positive high risk HPV screen Colposcopy normal with negative ECC   Obesity    Thyroid disease    Past Surgical History:  Past Surgical History:  Procedure Laterality Date   CHOLECYSTECTOMY      Social History:  reports that she has never smoked. She does not have any smokeless tobacco history on file. She reports that she does not drink alcohol and does not use drugs. Family History: family history includes Breast cancer in her maternal grandmother; Diabetes in her maternal grandmother; Hyperlipidemia in her maternal grandmother.   HOME MEDICATIONS: Allergies as of 10/11/2022    No Known Allergies      Medication List        Accurate as of October 11, 2022  7:04 AM. If you have any questions, ask your nurse or doctor.          ALPRAZolam 0.25 MG tablet Commonly known as: XANAX Take 0.25 mg by mouth at bedtime as needed for anxiety.   amLODipine 5 MG tablet Commonly known as: NORVASC Take 5 mg by mouth daily.   CELEXA PO Take 20 mg by mouth daily at 6 (six) AM.   levothyroxine 200 MCG tablet Commonly known as: SYNTHROID Take 1 tablet (200 mcg total) by mouth daily.   Vitamin D3 1.25 MG (50000 UT) Caps Take 1 capsule by mouth once a week.          REVIEW OF SYSTEMS: A comprehensive ROS was conducted with the patient and is negative except as per HPI     OBJECTIVE:  VS: There were no vitals taken for this visit.   Wt Readings from Last 3 Encounters:  01/19/22 (!) 345 lb (156.5 kg)  08/12/21 (!) 345 lb (156.5 kg)  02/19/14 (!) 319 lb 9.6 oz (145 kg)     EXAM: General: Pt appears well and is in NAD  Neck: General: Supple without adenopathy. Thyroid: Thyroid size normal.  No goiter or nodules appreciated.   Lungs: Clear with good BS bilat with no rales, rhonchi, or wheezes  Heart: Auscultation: RRR.  Abdomen: Normoactive bowel sounds, soft, nontender, without masses or  organomegaly palpable  Extremities:  BL LE: No pretibial edema normal ROM and strength.  Mental Status: Judgment, insight: Intact Orientation: Oriented to time, place, and person Mood and affect: No depression, anxiety, or agitation     DATA REVIEWED:  Latest Reference Range & Units 10/11/22 11:32  VITD 30.00 - 100.00 ng/mL 28.67 (L)  Vitamin B12 211 - 911 pg/mL 256  TSH 0.35 - 5.50 uIU/mL 0.10 (L)  (L): Data is abnormally low    Latest Reference Range & Units 08/12/21 08:10  Thyroperoxidase Ab SerPl-aCnc <9 IU/mL 330 (H)   ASSESSMENT/PLAN/RECOMMENDATIONS:   Hashimoto's Thyroiditis :  - Pt with fatigue  - Pt educated extensively on the correct way  to take levothyroxine (first thing in the morning with water, 30 minutes before eating or taking other medications). - Pt encouraged to double dose the following day if she were to miss a dose given long half-life of levothyroxine. - TSH low, will reduce levothyroxine as below    Medications : levothyroxine 200 mcg , half a tablet on Sundays and 1 tab rest of the week    2. Vitamin D Deficiency:   - Continues to be low despite being on Vitamin D 5000 iu  - Will switch to [employer] Ergocalciferol 50,000 iu weekly  Stop OTC vitamin D    3. Low B-12 :  -She is on a multivitamin - B12 still low, will add an addition B12 1000 mcg daily     4.  Fatigue:   -Due to some features of Cushing syndrome, we have opted to proceed with dexamethasone suppression test    F/U in 6 months     Addendum: Discussed lab results with the patient on 01/20/2022  Signed electronically by: Lyndle Herrlich, MD  [facility]  [facility] [address] [place] [place], [place] [address] Phone: [phone] FAX: [phone]   CC: Penelope Galas, MD [address] [place] [address] Phone: [phone] Fax: [phone]   Return to Endocrinology clinic as below: Future Appointments  Date Time Provider Department Center  10/11/2022 11:10 AM Dawn Stephens, Konrad Dolores, MD [facility] None

## 2022-10-11 NOTE — Patient Instructions (Signed)
Instructions for Dexamethasone Suppression Test   Step 1: Choose a morning when you can come to our lab at 8:00 am for a blood draw.   Step 2: On the night before the blood draw, take one 1 mg tablet of dexamethasone at 11:30 pm.  The timing is VERY important!   Step 3: The next morning, go to the lab for blood work at 8:00 am.  Dennis Bast do not have to be on an empty stomach, but the timing is VERY important!

## 2022-10-12 MED ORDER — LEVOTHYROXINE SODIUM 200 MCG PO TABS: 200.0000 ug | ORAL_TABLET | Freq: Every day | ORAL | 3 refills | Status: DC

## 2022-10-15 ENCOUNTER — Encounter: Payer: Self-pay | Admitting: Internal Medicine

## 2022-10-16 MED ORDER — ERGOCALCIFEROL 1.25 MG (50000 UT) PO CAPS: 50000.0000 [IU] | ORAL_CAPSULE | ORAL | 3 refills | Status: DC

## 2023-04-12 ENCOUNTER — Other Ambulatory Visit: Payer: 59

## 2023-04-12 ENCOUNTER — Ambulatory Visit: Payer: 59 | Admitting: Internal Medicine

## 2023-09-24 ENCOUNTER — Telehealth: Payer: Self-pay

## 2023-09-24 ENCOUNTER — Other Ambulatory Visit: Payer: 59

## 2023-09-24 ENCOUNTER — Ambulatory Visit: Payer: 59 | Admitting: Internal Medicine

## 2023-09-24 DIAGNOSIS — R5383 Other fatigue: Secondary | ICD-10-CM

## 2023-09-24 NOTE — Telephone Encounter (Signed)
 Orders Placed This Encounter  Procedures   Cortisol

## 2023-09-24 NOTE — Progress Notes (Deleted)
 Name: Dawn Stephens  MRN/ DOB: [id number], 04-Mar-1981    Age/ Sex: 43 y.o., female    PCP: Meredeth Prentice KIDD, MD   Reason for Endocrinology Evaluation: Hypothyroidism     Date of Initial Endocrinology Evaluation: 08/12/2021    HPI: Dawn Stephens is a 43 y.o. female with a past medical history of HTN and hypothyroidism. The patient presented for initial endocrinology clinic visit on 08/12/2021 for consultative assistance with her Hypothyroidism.   She has been diagnosed with hypothyroidism at age 20. She was on LT-4 replacement then switched to armour thyroid  until 2017 due to fatigue, hair loss and weight gain. She was switched to levothyroxine  06/2021    She does not have sleep apnea   Mother and maternal aunt with thyroid  disease   Attempted to screen for Cushing syndrome with dexamethasone  suppression test, but the patient did not have her labs 2024   SUBJECTIVE:     Today (09/24/23):  Dawn Stephens is here for follow-up on Hashimoto's thyroiditis  Continues with fatigue  She sees a therapist and psychiatry for depression , recently discontinue Wellbuterin and on Abilify  Denies constipation  Denies palpitations  Denies local neck swelling    Levothyroxine  200 mcg, half a tablet on Sundays and 1 tab rest of the week   Ergocalciferol  50,000 international unit  weekly    HISTORY:  Past Medical History:  Past Medical History:  Diagnosis Date   Anxiety    Cholecystitis    Cholelithiasis    LGSIL (low grade squamous intraepithelial dysplasia) 01/2014   Positive high risk HPV screen Colposcopy normal with negative ECC   Obesity    Thyroid  disease    Past Surgical History:  Past Surgical History:  Procedure Laterality Date   CHOLECYSTECTOMY      Social History:  reports that she has never smoked. She does not have any smokeless tobacco history on file. She reports that she does not drink alcohol and does not use drugs. Family History: family  history includes Breast cancer in her maternal grandmother; Diabetes in her maternal grandmother; Hyperlipidemia in her maternal grandmother.   HOME MEDICATIONS: Allergies as of 09/24/2023   No Known Allergies      Medication List        Accurate as of September 24, 2023  7:00 AM. If you have any questions, ask your nurse or doctor.          amLODipine 5 MG tablet Commonly known as: NORVASC Take 5 mg by mouth daily.   ARIPiprazole 5 MG tablet Commonly known as: ABILIFY Take 5 mg by mouth every morning.   ergocalciferol  1.25 MG (50000 UT) capsule Commonly known as: VITAMIN D2 Take 1 capsule (50,000 Units total) by mouth once a week.   levothyroxine  200 MCG tablet Commonly known as: SYNTHROID  Take 1 tablet (200 mcg total) by mouth daily.   Vitamin D3 1.25 MG (50000 UT) Caps Take 1 capsule by mouth once a week.          REVIEW OF SYSTEMS: A comprehensive ROS was conducted with the patient and is negative except as per HPI     OBJECTIVE:  VS: There were no vitals taken for this visit.   Wt Readings from Last 3 Encounters:  10/11/22 (!) 339 lb (153.8 kg)  01/19/22 (!) 345 lb (156.5 kg)  08/12/21 (!) 345 lb (156.5 kg)     EXAM: General: Pt appears well and is in NAD  Neck: General: Supple without adenopathy.  Thyroid : Thyroid  size normal.  No goiter or nodules appreciated.   Lungs: Clear with good BS bilat   Heart: Auscultation: RRR.  Abdomen: Normoactive bowel sounds, soft, nontender  Extremities:  BL LE: No pretibial edema   Mental Status: Judgment, insight: Intact Orientation: Oriented to time, place, and person Mood and affect: No depression, anxiety, or agitation     DATA REVIEWED:  Latest Reference Range & Units 10/11/22 11:32  VITD 30.00 - 100.00 ng/mL 28.67 (L)  Vitamin B12 211 - 911 pg/mL 256  TSH 0.35 - 5.50 uIU/mL 0.10 (L)  (L): Data is abnormally low    Latest Reference Range & Units 08/12/21 08:10  Thyroperoxidase Ab SerPl-aCnc <9  IU/mL 330 (H)   ASSESSMENT/PLAN/RECOMMENDATIONS:   Hashimoto's Thyroiditis :  - Pt with fatigue  - Pt educated extensively on the correct way to take levothyroxine  (first thing in the morning with water, 30 minutes before eating or taking other medications). - Pt encouraged to double dose the following day if she were to miss a dose given long half-life of levothyroxine . - TSH low, will reduce levothyroxine  as below    Medications : levothyroxine  200 mcg , half a tablet on Sundays and 1 tab rest of the week    2. Vitamin D  Deficiency:   - Continues to be low despite being on Vitamin D  5000 iu  - Will switch to Ergocalciferol    Start Ergocalciferol  50,000 iu weekly  Stop OTC vitamin D     3. Low B-12 :  -She is on a multivitamin - B12 still low, will add an addition B12 1000 mcg daily        F/U in 6 months     Addendum: Discussed lab results with the patient on 01/20/2022  Signed electronically by: Stefano Redgie Butts, MD  [facility] Endocrinology  [facility] [address] [place] [place], [place] [address] Phone: [phone] FAX: [phone]   CC: Meredeth Prentice KIDD, MD [address] [place] [address] Phone: [phone] Fax: [phone]   Return to Endocrinology clinic as below: Future Appointments  Date Time Provider Department Center  09/24/2023  9:30 AM [facility]URO [facility] None  09/24/2023  9:50 AM Kaitlyn Franko, Donell Redgie, MD [facility] None

## 2023-12-18 ENCOUNTER — Other Ambulatory Visit: Payer: Self-pay | Admitting: Internal Medicine

## 2023-12-24 ENCOUNTER — Telehealth: Payer: Self-pay

## 2023-12-24 ENCOUNTER — Other Ambulatory Visit: Payer: Self-pay

## 2023-12-24 NOTE — Telephone Encounter (Signed)
 Pharmacy request for refill

## 2024-01-02 ENCOUNTER — Telehealth: Payer: Self-pay | Admitting: Internal Medicine

## 2024-01-02 MED ORDER — LEVOTHYROXINE SODIUM 200 MCG PO TABS: 200.0000 ug | ORAL_TABLET | Freq: Every day | ORAL | 1 refills | Status: DC

## 2024-01-02 NOTE — Telephone Encounter (Signed)
 MEDICATION: levothyroxine  levothyroxine  (SYNTHROID ) 200 MCG tablet  PHARMACY:    [facility] [address], [place] - [address] (Ph: [phone])    HAS THE PATIENT CONTACTED THEIR PHARMACY?  Yes  IS THIS A 90 DAY SUPPLY : Yes  IS PATIENT OUT OF MEDICATION: Yes  IF NOT; HOW MUCH IS LEFT:   LAST APPOINTMENT DATE: @1 /31/2024  NEXT APPOINTMENT DATE:@5 /29/2025  DO WE HAVE YOUR PERMISSION TO LEAVE A DETAILED MESSAGE?:Yes OTHER COMMENTS:    **Let patient know to contact pharmacy at the end of the day to make sure medication is ready. **  ** Please notify patient to allow 48-72 hours to process**  **Encourage patient to contact the pharmacy for refills or they can request refills through [facility]**

## 2024-01-02 NOTE — Telephone Encounter (Signed)
 Done

## 2024-02-07 ENCOUNTER — Ambulatory Visit: Admitting: Internal Medicine

## 2024-02-11 ENCOUNTER — Encounter: Payer: Self-pay | Admitting: Internal Medicine

## 2024-02-11 ENCOUNTER — Ambulatory Visit (INDEPENDENT_AMBULATORY_CARE_PROVIDER_SITE_OTHER): Admitting: Internal Medicine

## 2024-02-11 VITALS — BP 124/70 | HR 77 | Ht 66.0 in | Wt 311.6 lb

## 2024-02-11 DIAGNOSIS — E063 Autoimmune thyroiditis: Secondary | ICD-10-CM

## 2024-02-11 NOTE — Progress Notes (Signed)
 Name: Dawn Stephens  MRN/ DOB: [id number], [birth year]    Age/ Sex: 43 y.o., female    PCP: Zadie Herter, MD   Reason for Endocrinology Evaluation: Hypothyroidism     Date of Initial Endocrinology Evaluation: 08/12/2021    HPI: [name] is a 43 y.o. female with a past medical history of HTN and hypothyroidism. The patient presented for initial endocrinology clinic visit on 08/12/2021 for consultative assistance with her Hypothyroidism.   She has been diagnosed with hypothyroidism at age 14. She was on LT-4 replacement then switched to armour thyroid  until 2017 due to fatigue, hair loss and weight gain. She was switched to levothyroxine  06/2021    She does not have sleep apnea   Mother and maternal aunt with thyroid  disease    SUBJECTIVE:     Today (02/11/24):  Dawn Stephens is here for follow-up on Hashimoto's thyroiditis. She has not been to our clinic in ~ 18 months due to loss of health insurance  She was without levothyroxine  for ~ 9 months but restarted again in 10/2023   Pt has been noted with weight loss  Denies constipation  Has noted mild palpitations and tremors  Denies local neck swelling   Was seeing psychiatry at some point for anxiety    Levothyroxine  200 mcg, 1 tablet daily    HISTORY:  Past Medical History:  Past Medical History:  Diagnosis Date   Anxiety    Cholecystitis    Cholelithiasis    LGSIL (low grade squamous intraepithelial dysplasia) 01/2014   Positive high risk HPV screen Colposcopy normal with negative ECC   Obesity    Thyroid  disease    Past Surgical History:  Past Surgical History:  Procedure Laterality Date   CHOLECYSTECTOMY      Social History:  reports that she has never smoked. She does not have any smokeless tobacco history on file. She reports that she does not drink alcohol and does not use drugs. Family History: family history includes Breast cancer in her maternal grandmother; Diabetes in her  maternal grandmother; Hyperlipidemia in her maternal grandmother.   HOME MEDICATIONS: Allergies as of 02/11/2024   No Known Allergies      Medication List        Accurate as of February 11, 2024  2:45 PM. If you have any questions, ask your nurse or doctor.          amLODipine 5 MG tablet Commonly known as: NORVASC Take 5 mg by mouth daily.   ARIPiprazole 5 MG tablet Commonly known as: ABILIFY Take 5 mg by mouth every morning.   ergocalciferol  1.25 MG (50000 UT) capsule Commonly known as: VITAMIN D2 Take 1 capsule (50,000 Units total) by mouth once a week.   levothyroxine  200 MCG tablet Commonly known as: SYNTHROID  Take 1 tablet (200 mcg total) by mouth daily.   phentermine 37.5 MG tablet Commonly known as: ADIPEX-P Take by mouth.   Vitamin D3 1.25 MG (50000 UT) Caps Take 1 capsule by mouth once a week.          REVIEW OF SYSTEMS: A comprehensive ROS was conducted with the patient and is negative except as per HPI     OBJECTIVE:  VS: BP 124/70 (BP Location: Left Arm, Patient Position: Sitting, Cuff Size: Normal)   Pulse 77   Ht 5\' 6"  (1.676 m)   Wt (!) 311 lb 9.6 oz (141.3 kg)   SpO2 99%   BMI 50.29 kg/m    Wt  Readings from Last 3 Encounters:  02/11/24 (!) 311 lb 9.6 oz (141.3 kg)  10/11/22 (!) 339 lb (153.8 kg)  01/19/22 (!) 345 lb (156.5 kg)     EXAM: General: Pt appears well and is in NAD  Neck:  Thyroid : No goiter or nodules appreciated.   Lungs: Clear with good BS bilat   Heart: Auscultation: RRR.  Abdomen: Soft, nontender  Extremities:  BL LE: No pretibial edema normal   Mental Status: Judgment, insight: Intact Orientation: Oriented to time, place, and person Mood and affect: No depression, anxiety, or agitation     DATA REVIEWED:   Latest Reference Range & Units 02/11/24 14:49  TSH mIU/L 2.98  T4,Free(Direct) 0.8 - 1.8 ng/dL 1.3     Latest Reference Range & Units 08/12/21 08:10  Thyroperoxidase Ab SerPl-aCnc <9 IU/mL 330 (H)    ASSESSMENT/PLAN/RECOMMENDATIONS:   Hashimoto's Thyroiditis :  - Pt was without levothyroxine  for approximately 9 months, we discussed the importance of taking levothyroxine  as prescribed, we discussed the risk of myxedema coma with uncontrolled hypothyroid -I also explained to the patient the importance of checking the price to the pharmacy even without insurance, we also may consider patient assistance if needed in the future, but the patient should never go without levothyroxine  for more than a week - She is to be on desiccated thyroid  which exacerbated her anxiety - TFTs normal, no change  Medications : Continue levothyroxine  200 mcg , 1 tablet daily     F/U in 3 months     Addendum: Discussed lab results with the patient on 01/20/2022  Signed electronically by: Natale Bail, MD  [facility] Endocrinology  [facility] [address] [place] [place], [place] [address] Phone: [phone] FAX: [phone]   CC: Zadie Herter, MD [address] [place] [address] Phone: [phone] Fax: [phone]   Return to Endocrinology clinic as below: Future Appointments  Date Time Provider Department Center  05/16/2024  9:30 AM Zariana Strub, Julian Obey, MD [facility] None

## 2024-02-11 NOTE — Patient Instructions (Signed)

## 2024-02-12 ENCOUNTER — Ambulatory Visit: Payer: Self-pay | Admitting: Internal Medicine

## 2024-02-12 LAB — T4, FREE: Free T4: 1.3 ng/dL (ref 0.8–1.8)

## 2024-02-12 LAB — TSH: TSH: 2.98 m[IU]/L

## 2024-02-12 MED ORDER — LEVOTHYROXINE SODIUM 200 MCG PO TABS: 200.0000 ug | ORAL_TABLET | Freq: Every day | ORAL | 2 refills | Status: DC

## 2024-03-03 ENCOUNTER — Telehealth: Payer: Self-pay

## 2024-03-03 NOTE — Telephone Encounter (Signed)
 Euthyrox  not longer available. [facility] would like to know if it's okay to switch manufacturer?

## 2024-03-04 MED ORDER — LEVOTHYROXINE SODIUM 200 MCG PO TABS: 200.0000 ug | ORAL_TABLET | Freq: Every day | ORAL | 2 refills | Status: AC

## 2024-05-16 ENCOUNTER — Ambulatory Visit: Admitting: Internal Medicine
